# Patient Record
Sex: Male | Born: 1960 | Race: White | Hispanic: No | Marital: Married
Health system: Midwestern US, Community
[De-identification: ages and names within clinical notes are randomized; demographics above are authoritative.]

## PROBLEM LIST (undated history)

## (undated) DIAGNOSIS — Z8719 Personal history of other diseases of the digestive system: Secondary | ICD-10-CM

## (undated) DIAGNOSIS — M25511 Pain in right shoulder: Secondary | ICD-10-CM

## (undated) DIAGNOSIS — M109 Gout, unspecified: Secondary | ICD-10-CM

## (undated) DIAGNOSIS — I10 Essential (primary) hypertension: Secondary | ICD-10-CM

## (undated) DIAGNOSIS — E785 Hyperlipidemia, unspecified: Secondary | ICD-10-CM

## (undated) DIAGNOSIS — N289 Disorder of kidney and ureter, unspecified: Secondary | ICD-10-CM

## (undated) DIAGNOSIS — N401 Enlarged prostate with lower urinary tract symptoms: Secondary | ICD-10-CM

## (undated) HISTORY — DX: Pain in right shoulder: M25.511

## (undated) HISTORY — DX: Hyperlipidemia, unspecified: E78.5

## (undated) HISTORY — DX: Disorder of kidney and ureter, unspecified: N28.9

## (undated) HISTORY — DX: Gout, unspecified: M10.9

## (undated) HISTORY — DX: Essential (primary) hypertension: I10

## (undated) HISTORY — DX: Personal history of other diseases of the digestive system: Z87.19

---

## 2001-04-13 DIAGNOSIS — Z8719 Personal history of other diseases of the digestive system: Secondary | ICD-10-CM

## 2001-04-13 HISTORY — DX: Personal history of other diseases of the digestive system: Z87.19

## 2004-12-10 ENCOUNTER — Ambulatory Visit: Payer: Self-pay | Admitting: Internal Medicine

## 2005-05-29 ENCOUNTER — Ambulatory Visit: Payer: Self-pay | Admitting: Internal Medicine

## 2005-06-26 ENCOUNTER — Ambulatory Visit: Payer: Self-pay | Admitting: Internal Medicine

## 2006-01-05 ENCOUNTER — Ambulatory Visit: Payer: Self-pay | Admitting: Internal Medicine

## 2006-01-21 ENCOUNTER — Ambulatory Visit: Payer: Self-pay | Admitting: Internal Medicine

## 2006-12-14 ENCOUNTER — Ambulatory Visit: Payer: Self-pay | Admitting: Internal Medicine

## 2006-12-14 DIAGNOSIS — I10 Essential (primary) hypertension: Secondary | ICD-10-CM

## 2006-12-14 DIAGNOSIS — E785 Hyperlipidemia, unspecified: Secondary | ICD-10-CM | POA: Insufficient documentation

## 2006-12-30 ENCOUNTER — Telehealth: Payer: Self-pay | Admitting: Internal Medicine

## 2006-12-31 ENCOUNTER — Ambulatory Visit: Payer: Self-pay | Admitting: Internal Medicine

## 2007-01-07 ENCOUNTER — Ambulatory Visit: Payer: Self-pay | Admitting: Internal Medicine

## 2007-01-10 LAB — CONVERTED CEMR LAB
BUN: 19 mg/dL (ref 6–23)
CO2: 30 meq/L (ref 19–32)
Calcium: 9.6 mg/dL (ref 8.4–10.5)
Chloride: 106 meq/L (ref 96–112)
Cholesterol: 262 mg/dL (ref 0–200)
Creatinine, Ser: 1.7 mg/dL — ABNORMAL HIGH (ref 0.4–1.5)
Direct LDL: 169.3 mg/dL
GFR calc Af Amer: 56 mL/min
GFR calc non Af Amer: 46 mL/min
Glucose, Bld: 94 mg/dL (ref 70–99)
HDL: 42.1 mg/dL (ref >39.0)
Potassium: 5.2 meq/L — ABNORMAL HIGH (ref 3.5–5.1)
Sodium: 140 meq/L (ref 135–145)
Total CHOL/HDL Ratio: 6.2
Triglycerides: 185 mg/dL — ABNORMAL HIGH (ref 0–149)
VLDL: 37 mg/dL (ref 0–40)

## 2007-03-22 ENCOUNTER — Ambulatory Visit: Payer: Self-pay | Admitting: Internal Medicine

## 2007-05-12 ENCOUNTER — Ambulatory Visit: Payer: Self-pay | Admitting: Internal Medicine

## 2007-05-16 LAB — CONVERTED CEMR LAB
BUN: 24 mg/dL — ABNORMAL HIGH
CO2: 33 meq/L — ABNORMAL HIGH
Calcium: 10 mg/dL
Chloride: 103 meq/L
Cholesterol: 296 mg/dL
Creatinine, Ser: 1.8 mg/dL — ABNORMAL HIGH
Direct LDL: 154.7 mg/dL
GFR calc Af Amer: 53 mL/min
GFR calc non Af Amer: 43 mL/min
Glucose, Bld: 99 mg/dL
HDL: 42.6 mg/dL
Potassium: 5.5 meq/L — ABNORMAL HIGH
Sodium: 141 meq/L
Total CHOL/HDL Ratio: 6.9
Triglycerides: 241 mg/dL
VLDL: 48 mg/dL — ABNORMAL HIGH

## 2007-05-23 ENCOUNTER — Ambulatory Visit: Payer: Self-pay | Admitting: Internal Medicine

## 2007-05-25 ENCOUNTER — Telehealth: Payer: Self-pay | Admitting: Internal Medicine

## 2007-05-27 ENCOUNTER — Encounter: Payer: Self-pay | Admitting: Internal Medicine

## 2007-08-08 ENCOUNTER — Ambulatory Visit: Payer: Self-pay | Admitting: Internal Medicine

## 2007-08-08 LAB — CONVERTED CEMR LAB
Alkaline Phosphatase: 58 units/L (ref 39–117)
Bilirubin, Direct: 0.1 mg/dL (ref 0.0–0.3)
LDL Cholesterol: 127 mg/dL — ABNORMAL HIGH (ref 0–99)
Total Bilirubin: 1.2 mg/dL (ref 0.3–1.2)
Total CHOL/HDL Ratio: 4.5
VLDL: 21 mg/dL (ref 0–40)

## 2007-08-31 ENCOUNTER — Ambulatory Visit: Payer: Self-pay | Admitting: Internal Medicine

## 2007-09-28 ENCOUNTER — Encounter: Payer: Self-pay | Admitting: Internal Medicine

## 2008-02-20 ENCOUNTER — Ambulatory Visit: Payer: Self-pay | Admitting: Internal Medicine

## 2008-02-20 LAB — CONVERTED CEMR LAB
ALT: 73 units/L — ABNORMAL HIGH (ref 0–53)
CO2: 31 meq/L (ref 19–32)
Calcium: 9.3 mg/dL (ref 8.4–10.5)
Creatinine, Ser: 1.7 mg/dL — ABNORMAL HIGH (ref 0.4–1.5)
GFR calc Af Amer: 56 mL/min
GFR calc non Af Amer: 46 mL/min
HDL: 46.9 mg/dL (ref >39.0)
LDL Cholesterol: 95 mg/dL (ref 0–99)
Total Bilirubin: 1.2 mg/dL (ref 0.3–1.2)
Total CHOL/HDL Ratio: 3.5
Triglycerides: 102 mg/dL (ref 0–149)
VLDL: 20 mg/dL (ref 0–40)

## 2008-02-27 ENCOUNTER — Ambulatory Visit: Payer: Self-pay | Admitting: Internal Medicine

## 2008-08-20 ENCOUNTER — Ambulatory Visit: Payer: Self-pay | Admitting: Internal Medicine

## 2008-08-20 LAB — CONVERTED CEMR LAB
BUN: 25 mg/dL — ABNORMAL HIGH (ref 6–23)
Basophils Absolute: 0 10*3/uL (ref 0.0–0.1)
Bilirubin Urine: NEGATIVE
Chloride: 106 meq/L (ref 96–112)
Cholesterol: 197 mg/dL (ref 0–200)
Eosinophils Absolute: 0.2 10*3/uL (ref 0.0–0.7)
Glucose, Bld: 99 mg/dL (ref 70–99)
Glucose, Urine, Semiquant: NEGATIVE
HCT: 44.1 % (ref 39.0–52.0)
Ketones, urine, test strip: NEGATIVE
Lymphs Abs: 1.3 10*3/uL (ref 0.7–4.0)
MCV: 90.6 fL (ref 78.0–100.0)
Monocytes Absolute: 0.4 10*3/uL (ref 0.1–1.0)
Platelets: 176 10*3/uL (ref 150.0–400.0)
Potassium: 4.4 meq/L (ref 3.5–5.1)
RDW: 12.6 % (ref 11.5–14.6)
TSH: 1.38 microintl units/mL (ref 0.35–5.50)
Total Bilirubin: 1.2 mg/dL (ref 0.3–1.2)
VLDL: 17.6 mg/dL (ref 0.0–40.0)
pH: 6.5

## 2008-08-27 ENCOUNTER — Ambulatory Visit: Payer: Self-pay | Admitting: Internal Medicine

## 2008-12-07 ENCOUNTER — Encounter: Payer: Self-pay | Admitting: Internal Medicine

## 2009-02-18 ENCOUNTER — Ambulatory Visit: Payer: Self-pay | Admitting: Internal Medicine

## 2009-02-18 LAB — CONVERTED CEMR LAB
ALT: 46 units/L (ref 0–53)
BUN: 24 mg/dL — ABNORMAL HIGH (ref 6–23)
Bilirubin, Direct: 0 mg/dL (ref 0.0–0.3)
Cholesterol: 187 mg/dL (ref 0–200)
GFR calc non Af Amer: 42.92 mL/min (ref >60)
HDL: 47 mg/dL (ref >39.00)
Potassium: 4.7 meq/L (ref 3.5–5.1)
Sodium: 138 meq/L (ref 135–145)
Total Protein: 6.9 g/dL (ref 6.0–8.3)
VLDL: 33.6 mg/dL (ref 0.0–40.0)

## 2009-03-13 HISTORY — PX: UMBILICAL HERNIA REPAIR: SHX196

## 2009-04-02 ENCOUNTER — Ambulatory Visit: Payer: Self-pay | Admitting: Internal Medicine

## 2010-03-20 ENCOUNTER — Ambulatory Visit: Payer: Self-pay | Admitting: Internal Medicine

## 2010-03-20 LAB — CONVERTED CEMR LAB
Alkaline Phosphatase: 56 units/L (ref 39–117)
BUN: 27 mg/dL — ABNORMAL HIGH (ref 6–23)
Bilirubin Urine: NEGATIVE
Bilirubin, Direct: 0.1 mg/dL (ref 0.0–0.3)
CO2: 27 meq/L (ref 19–32)
Calcium: 9.6 mg/dL (ref 8.4–10.5)
Chloride: 102 meq/L (ref 96–112)
Cholesterol: 208 mg/dL — ABNORMAL HIGH (ref 0–200)
Creatinine, Ser: 1.9 mg/dL — ABNORMAL HIGH (ref 0.4–1.5)
Direct LDL: 105.3 mg/dL
Eosinophils Absolute: 0.2 10*3/uL (ref 0.0–0.7)
Glucose, Urine, Semiquant: NEGATIVE
Ketones, urine, test strip: NEGATIVE
Lymphocytes Relative: 33.6 % (ref 12.0–46.0)
MCHC: 34.6 g/dL (ref 30.0–36.0)
MCV: 90.8 fL (ref 78.0–100.0)
Monocytes Absolute: 0.4 10*3/uL (ref 0.1–1.0)
Neutrophils Relative %: 55.5 % (ref 43.0–77.0)
Platelets: 239 10*3/uL (ref 150.0–400.0)
Total Bilirubin: 0.7 mg/dL (ref 0.3–1.2)
Total CHOL/HDL Ratio: 5
Triglycerides: 347 mg/dL — ABNORMAL HIGH (ref 0.0–149.0)
Urobilinogen, UA: 0.2
VLDL: 69.4 mg/dL — ABNORMAL HIGH (ref 0.0–40.0)
WBC: 5.3 10*3/uL (ref 4.5–10.5)
pH: 6

## 2010-03-27 ENCOUNTER — Ambulatory Visit: Payer: Self-pay | Admitting: Internal Medicine

## 2010-03-27 DIAGNOSIS — N183 Chronic kidney disease, stage 3 (moderate): Secondary | ICD-10-CM

## 2010-05-15 NOTE — Assessment & Plan Note (Signed)
Vital Signs:  Patient profile:   50 year old male Height:      73 inches Weight:      185 pounds Temp:     98.4 degrees F BP sitting:   116 / 82  Vitals Entered By: Alfred Levins, CMA (March 27, 2010 8:20 AM)  History of Present Illness: CPX  recent URI---sxs resolving  Current Problems (verified): 1)  Preventive Health Care  (ICD-V70.0) 2)  Renal Insufficiency  (ICD-588.9) 3)  Hyperlipidemia  (ICD-272.4) 4)  Hypertension  (ICD-401.9)  Current Medications (verified): 1)  Diclofenac Sodium 75 Mg Tbec (Diclofenac Sodium) .... Take 1 Tablet By Mouth Once A Day As Needed Pain 2)  Simvastatin 40 Mg Tabs (Simvastatin) .... Take One Tablet At Bedtime 3)  Losartan Potassium 100 Mg Tabs (Losartan Potassium) .... Take 1 Tablet By Mouth Once A Day  Allergies (verified): 1)  ! Ace Inhibitors  Past History:  Past Medical History: Last updated: 02/27/2008 Hyperlipidemia Hypertension Renal insufficiency--has had evaluation [place]. (result of infection as a child)  Past Surgical History: Last updated: 04/02/2009 umbilical hernia repair 12/10  Family History: Last updated: 05/23/2007 GI cancer--mother father COPD--hx MI/CHF mother with hyperlipidemeia-elevated HDL  Social History: Last updated: 12/31/2006 Married Never Smoked travels frequently with work  Risk Factors: Exercise: yes (12/14/2006)  Risk Factors: Smoking Status: never (04/02/2009)  Physical Exam  General:  well-developed well-nourished male in no acute distress. He appears intake. HEENT exam atraumatic, normocephalic symmetric her muscles are intact. Neck is supple. Neck without jugular venous distention. Submandibular glands are palpable normal. Neck:  No deformities, masses, or tenderness noted. Lungs:  normal respiratory effort and no intercostal retractions.   Heart:  normal rate and regular rhythm.   Abdomen:  soft and non-tender.   Skin:  turgor normal and color normal.   Psych:  normally  interactive and good eye contact.     Impression & Recommendations:  Problem # 1:  PREVENTIVE HEALTH CARE (ICD-V70.0) health maint utd  Problem # 2:  RENAL INSUFFICIENCY (ICD-588.9) chronic  Problem # 3:  HYPERLIPIDEMIA (ICD-272.4)  His updated medication list for this problem includes:    Simvastatin 40 Mg Tabs (Simvastatin) .Marland Kitchen... Take one tablet at bedtime  Labs Reviewed: SGOT: 32 (03/20/2010)   SGPT: 37 (03/20/2010)   HDL:39.80 (03/20/2010), 47.00 (02/18/2009)  LDL:106 (02/18/2009), 128 (08/20/2008)  Chol:208 (03/20/2010), 187 (02/18/2009)  Trig:347.0 (03/20/2010), 168.0 (02/18/2009)  Problem # 4:  HYPERTENSION (ICD-401.9)  His updated medication list for this problem includes:    Losartan Potassium 100 Mg Tabs (Losartan potassium) .Marland Kitchen... Take 1 tablet by mouth once a day  BP today: 116/82 Prior BP: 122/60 (04/02/2009)  Labs Reviewed: K+: 4.9 (03/20/2010) Creat: : 1.9 (03/20/2010)   Chol: 208 (03/20/2010)   HDL: 39.80 (03/20/2010)   LDL: 106 (02/18/2009)   TG: 347.0 (03/20/2010)  Complete Medication List: 1)  Diclofenac Sodium 75 Mg Tbec (Diclofenac sodium) .... Take 1 tablet by mouth once a day as needed pain 2)  Simvastatin 40 Mg Tabs (Simvastatin) .... Take one tablet at bedtime 3)  Losartan Potassium 100 Mg Tabs (Losartan potassium) .... Take 1 tablet by mouth once a day  Patient Instructions: 1)  Please schedule a follow-up appointment in 6 months. labs only 2)  [id number]_[id number] Prescriptions: LOSARTAN POTASSIUM 100 MG TABS (LOSARTAN POTASSIUM) Take 1 tablet by mouth once a day  #90 x 3   Entered and Authorized by:   Birdie Sons MD   Signed by:   Birdie Sons MD on  03/27/2010   Method used:   Faxed to ...       [address] [facility] (mail-order)       [address]       [place], [place]  [address]       Ph: [phone]       Fax: [phone]   RxID:   [id number] SIMVASTATIN 40 MG TABS (SIMVASTATIN) Take one tablet at bedtime  #90 x 3   Entered and Authorized by:   Birdie Sons MD   Signed by:   Birdie Sons MD on 03/27/2010   Method used:   Faxed to ...       [facility] (mail-order)       [address]       [place], [place]  [address]       Ph: [phone]       Fax: [phone]   RxID:   [id number] LOSARTAN POTASSIUM 100 MG TABS (LOSARTAN POTASSIUM) Take 1 tablet by mouth once a day  #90 x 3   Entered and Authorized by:   Birdie Sons MD   Signed by:   Birdie Sons MD on 03/27/2010   Method used:   Print then Give to Patient   RxID:   [id number] SIMVASTATIN 40 MG TABS (SIMVASTATIN) Take one tablet at bedtime  #90 x 3   Entered and Authorized by:   Birdie Sons MD   Signed by:   Birdie Sons MD on 03/27/2010   Method used:   Print then Give to Patient   RxID:   [id number] NIOEVOJJ POTASSIUM 100 MG TABS (LOSARTAN POTASSIUM) Take 1 tablet by mouth once a day  #90 x 3   Entered and Authorized by:   Birdie Sons MD   Signed by:   Birdie Sons MD on 03/27/2010   Method used:   [facility]        [facility] [address] [facility]       [address]       [place], [place]  [address]       Ph: [phone] or [phone]       Fax: [phone]   RxID:   [id number] SIMVASTATIN 40 MG TABS (SIMVASTATIN) Take one tablet at bedtime  #90 x 3   Entered and Authorized by:   Birdie Sons MD   Signed by:   Birdie Sons MD on 03/27/2010   Method used:   [facility]        [facility] [address]       [address]       [place], [place]  [address]       Ph: [phone] or [phone]       Fax: [phone]   RxID:   [id number]    Orders Added: 1)  Est. Patient 40-64 years [phone]

## 2010-07-28 ENCOUNTER — Other Ambulatory Visit: Payer: Self-pay | Admitting: Internal Medicine

## 2010-07-28 DIAGNOSIS — Z0000 Encounter for general adult medical examination without abnormal findings: Secondary | ICD-10-CM

## 2010-09-19 ENCOUNTER — Encounter: Payer: Self-pay | Admitting: Internal Medicine

## 2010-09-26 ENCOUNTER — Other Ambulatory Visit: Payer: Self-pay

## 2010-10-10 ENCOUNTER — Other Ambulatory Visit (INDEPENDENT_AMBULATORY_CARE_PROVIDER_SITE_OTHER): Payer: Managed Care, Other (non HMO)

## 2010-10-10 DIAGNOSIS — N289 Disorder of kidney and ureter, unspecified: Secondary | ICD-10-CM

## 2010-10-10 LAB — BASIC METABOLIC PANEL
CO2: 28 mEq/L (ref 19–32)
Chloride: 105 mEq/L (ref 96–112)
Potassium: 4.8 mEq/L (ref 3.5–5.1)
Sodium: 139 mEq/L (ref 135–145)

## 2010-12-17 ENCOUNTER — Telehealth: Payer: Self-pay | Admitting: *Deleted

## 2010-12-17 NOTE — Telephone Encounter (Signed)
Pt is asking if Dr. Cato Mulligan will call in oral steroids for his poison ivy to [facility].?

## 2010-12-18 MED ORDER — PREDNISONE 20 MG PO TABS: 20.0000 mg | ORAL_TABLET | Freq: Two times a day (BID) | ORAL | Status: DC

## 2010-12-18 NOTE — Telephone Encounter (Signed)
Prednisone 40 mg po qd for 5 days

## 2010-12-18 NOTE — Telephone Encounter (Signed)
Notified pt on voicemail.

## 2010-12-18 NOTE — Telephone Encounter (Signed)
Pt called back and said that rash has spread all over his body. Pt isn't sure if its poison ivy or not. Rash is not oozing, but is very itchy and spreading. Pt req oral steroids to be called in to [facility] asap today.

## 2010-12-19 ENCOUNTER — Other Ambulatory Visit: Payer: Self-pay | Admitting: *Deleted

## 2010-12-19 MED ORDER — PREDNISONE 20 MG PO TABS: 20.0000 mg | ORAL_TABLET | Freq: Every day | ORAL | Status: AC

## 2010-12-19 MED ORDER — PREDNISONE 20 MG PO TABS: 20.0000 mg | ORAL_TABLET | Freq: Two times a day (BID) | ORAL | Status: AC

## 2010-12-19 NOTE — Telephone Encounter (Signed)
Pharmacy did not get meds. Called and sent electronically.

## 2011-02-27 ENCOUNTER — Other Ambulatory Visit: Payer: Self-pay | Admitting: *Deleted

## 2011-02-27 MED ORDER — SIMVASTATIN 40 MG PO TABS: 40.0000 mg | ORAL_TABLET | Freq: Every evening | ORAL | Status: DC

## 2011-02-27 MED ORDER — LOSARTAN POTASSIUM 100 MG PO TABS: 100.0000 mg | ORAL_TABLET | Freq: Every day | ORAL | Status: DC

## 2011-03-02 ENCOUNTER — Telehealth: Payer: Self-pay | Admitting: *Deleted

## 2011-03-02 NOTE — Telephone Encounter (Signed)
Pt needs to speak to [facility] ASAP for refills on his meds.  There is a note on the telephone with all the details.

## 2011-03-03 MED ORDER — LOSARTAN POTASSIUM 100 MG PO TABS: 100.0000 mg | ORAL_TABLET | Freq: Every day | ORAL | Status: DC

## 2011-03-03 MED ORDER — SIMVASTATIN 40 MG PO TABS: 40.0000 mg | ORAL_TABLET | Freq: Every evening | ORAL | Status: DC

## 2011-03-03 NOTE — Telephone Encounter (Signed)
Addended by: Alfred Levins D on: 03/03/2011 04:07 PM   Modules accepted: Orders

## 2011-03-03 NOTE — Telephone Encounter (Signed)
Refills sent to [facility] electronically, pt aware

## 2011-03-10 ENCOUNTER — Telehealth: Payer: Self-pay | Admitting: *Deleted

## 2011-03-10 NOTE — Telephone Encounter (Signed)
Patient is calling because he has had a cough and sinus pressure for 3 weeks.  He would like a rx called into [facility] if possible.

## 2011-03-10 NOTE — Telephone Encounter (Signed)
Left message on machine for patient to return our call 

## 2011-03-10 NOTE — Telephone Encounter (Signed)
mucinex dm bid for 7 days Saline nasal spray 3 times daily

## 2011-03-11 NOTE — Telephone Encounter (Signed)
patient  Is aware 

## 2011-03-20 ENCOUNTER — Other Ambulatory Visit (INDEPENDENT_AMBULATORY_CARE_PROVIDER_SITE_OTHER): Payer: Managed Care, Other (non HMO)

## 2011-03-20 ENCOUNTER — Telehealth: Payer: Self-pay | Admitting: Internal Medicine

## 2011-03-20 DIAGNOSIS — Z0000 Encounter for general adult medical examination without abnormal findings: Secondary | ICD-10-CM

## 2011-03-20 LAB — POCT URINALYSIS DIPSTICK
Glucose, UA: NEGATIVE
Nitrite, UA: NEGATIVE
Urobilinogen, UA: 0.2

## 2011-03-20 LAB — CBC WITH DIFFERENTIAL/PLATELET
Basophils Absolute: 0 10*3/uL (ref 0.0–0.1)
Hemoglobin: 15.4 g/dL (ref 13.0–17.0)
Lymphocytes Relative: 32.2 % (ref 12.0–46.0)
Monocytes Relative: 7.8 % (ref 3.0–12.0)
Neutro Abs: 3 10*3/uL (ref 1.4–7.7)
RDW: 13.2 % (ref 11.5–14.6)

## 2011-03-20 LAB — BASIC METABOLIC PANEL
Calcium: 9.6 mg/dL (ref 8.4–10.5)
GFR: 39.74 mL/min — ABNORMAL LOW (ref >60.00)
Glucose, Bld: 101 mg/dL — ABNORMAL HIGH (ref 70–99)
Sodium: 140 mEq/L (ref 135–145)

## 2011-03-20 LAB — LDL CHOLESTEROL, DIRECT: Direct LDL: 95 mg/dL

## 2011-03-20 LAB — TSH: TSH: 1.69 u[IU]/mL (ref 0.35–5.50)

## 2011-03-20 LAB — LIPID PANEL
Total CHOL/HDL Ratio: 4
VLDL: 44.8 mg/dL — ABNORMAL HIGH (ref 0.0–40.0)

## 2011-03-20 LAB — HEPATIC FUNCTION PANEL
AST: 26 U/L (ref 0–37)
Alkaline Phosphatase: 51 U/L (ref 39–117)
Total Bilirubin: 0.8 mg/dL (ref 0.3–1.2)

## 2011-03-20 LAB — PSA: PSA: 1.86 ng/mL (ref 0.10–4.00)

## 2011-03-20 MED ORDER — LOSARTAN POTASSIUM 100 MG PO TABS: 100.0000 mg | ORAL_TABLET | Freq: Every day | ORAL | Status: DC

## 2011-03-20 NOTE — Telephone Encounter (Signed)
rx sent in electronically, pt aware 

## 2011-03-20 NOTE — Telephone Encounter (Signed)
Please contact patient regarding medication refill he will need before his appointment.  It is his blood pressure medication - he is not sure of the name of the drug.

## 2011-03-31 ENCOUNTER — Ambulatory Visit: Payer: Managed Care, Other (non HMO) | Admitting: Internal Medicine

## 2011-04-28 ENCOUNTER — Ambulatory Visit (INDEPENDENT_AMBULATORY_CARE_PROVIDER_SITE_OTHER): Payer: Managed Care, Other (non HMO) | Admitting: Internal Medicine

## 2011-04-28 ENCOUNTER — Encounter: Payer: Self-pay | Admitting: Internal Medicine

## 2011-04-28 DIAGNOSIS — N259 Disorder resulting from impaired renal tubular function, unspecified: Secondary | ICD-10-CM

## 2011-04-28 DIAGNOSIS — I10 Essential (primary) hypertension: Secondary | ICD-10-CM

## 2011-04-28 NOTE — Patient Instructions (Signed)
Knee Exercises EXERCISES RANGE OF MOTION(ROM) AND STRETCHING EXERCISES These exercises may help you when beginning to rehabilitate your injury. Your symptoms may resolve with or without further involvement from your physician, physical therapist or athletic trainer. While completing these exercises, remember:   Restoring tissue flexibility helps normal motion to return to the joints. This allows healthier, less painful movement and activity.   An effective stretch should be held for at least 30 seconds.   A stretch should never be painful. You should only feel a gentle lengthening or release in the stretched tissue.  STRETCH - Knee Extension, Prone  Lie on your stomach on a firm surface, such as a bed or countertop. Place your right / left knee and leg just beyond the edge of the surface. You may wish to place a towel under the far end of your right / left thigh for comfort.   Relax your leg muscles and allow gravity to straighten your knee. Your clinician may advise you to add an ankle weight if more resistance is helpful for you.  You should feel a stretch in the back of your right / left knee.y. * Your physician, physical therapist or athletic trainer may ask you to add ankle weight to enhance your stretch.  RANGE OF MOTION - Knee Flexion, Active  Lie on your back with both knees straight. (If this causes back discomfort, bend your opposite knee, placing your foot flat on the floor.)   Slowly slide your heel back toward your buttocks until you feel a gentle stretch in the front of your knee or thigh.    Slowly slide your heel back to the starting position.    STRETCH - Quadriceps, Prone   Lie on your stomach on a firm surface, such as a bed or padded floor.   Bend your right / left knee and grasp your ankle. If you are unable to reach, your ankle or pant leg, use a belt around your foot to lengthen your reach.   Gently pull your heel toward your buttocks. Your knee should not slide  out to the side. You should feel a stretch in the front of your thigh and/or knee.   STRETCH - Hamstrings, Supine   Lie on your back. Loop a belt or towel over the ball of your right / left foot.   Straighten your right / left knee and slowly pull on the belt to raise your leg. Do not allow the right / left knee to bend. Keep your opposite leg flat on the floor.  STRENGTHENING EXERCISES These exercises may help you when beginning to rehabilitate your injury. They may resolve your symptoms with or without further involvement from your physician, physical therapist or athletic trainer. While completing these exercises, remember:   Muscles can gain both the endurance and the strength needed for everyday activities through controlled exercises.   Complete these exercises as instructed by your physician, physical therapist or athletic trainer. Progress the resistance and repetitions only as guided.   You may experience muscle soreness or fatigue, but the pain or discomfort you are trying to eliminate should never worsen during these exercises. If this pain does worsen, stop and make certain you are following the directions exactly. If the pain is still present after adjustments, discontinue the exercise until you can discuss the trouble with your clinician.  STRENGTH - Quadriceps, Isometrics  Lie on your back with your right / left leg extended and your opposite knee bent.   Gradually tense  the muscles in the front of your right / left thigh. You should see either your knee cap slide up toward your hip or increased dimpling just above the knee. This motion will push the back of the knee down toward the floor/mat/bed on which you are lying.    Relax the muscles slowly and completely in between each repetition.  STRENGTH - Quadriceps, Short Arcs   Lie on your back. Place a _____4_____ inch towel roll under your knee so that the knee slightly bends.   Raise only your lower leg by tightening the  muscles in the front of your thigh. Do not allow your thigh to rise.  STRENGTH - Quadriceps, Straight Leg Raises  Quality counts! Watch for signs that the quadriceps muscle is working to insure you are strengthening the correct muscles and not "cheating" by substituting with healthier muscles.  Lay on your back with your right / left leg extended and your opposite knee bent.   Tense the muscles in the front of your right / left thigh. You should see either your knee cap slide up or increased dimpling just above the knee. Your thigh may even quiver.   Tighten these muscles even more and raise your leg 4 to 6 inches off the floor.    Keeping these muscles tense, lower your leg.  Relax the muscles slowly and completely in between each repetition  STRENGTH - Hamstring, Curls  Lay on your stomach with your legs extended. (If you lay on a bed, your feet may hang over the edge.)   Tighten the muscles in the back of your thigh to bend your right / left knee up to 90 degrees. Keep your hips flat on the bed/floor

## 2011-04-28 NOTE — Assessment & Plan Note (Signed)
BP Readings from Last 3 Encounters:  04/28/11 130/84  03/27/10 116/82  04/02/09 122/60   Reasonable control--continue meds

## 2011-04-28 NOTE — Progress Notes (Signed)
Patient ID: Devon Taylor, male   DOB: 04-Jun-1960, 51 y.o.   MRN: [id number]  htn---needs f/u  Renal insufficiency---long term---see PMH  Bilateral knee pain- states that he had right \\knee  injury in early 1980s. Has pain with activity. States that he has had knees evaluated by [place] [facility]. He is able to do all activities even with knee pain.  Lipids: tolerating meds  Past Medical History  Diagnosis Date  . Hyperlipidemia   . Hypertension     History   Social History  . Marital Status: Married    Spouse Name: N/A    Number of Children: N/A  . Years of Education: N/A   Occupational History  . Not on file.   Social History Main Topics  . Smoking status: Never Smoker   . Smokeless tobacco: Not on file  . Alcohol Use: Yes  . Drug Use: No  . Sexually Active: Not on file   Other Topics Concern  . Not on file   Social History Narrative  . No narrative on file    Past Surgical History  Procedure Date  . Umbilical hernia repair 12/10    Family History  Problem Relation Age of Onset  . Cancer Mother     GI  . Hyperlipidemia Mother   . COPD Father   . Heart disease Father     MI    Allergies  Allergen Reactions  . Ace Inhibitors     REACTION: cough with enalapril    Current Outpatient Prescriptions on File Prior to Visit  Medication Sig Dispense Refill  . losartan (COZAAR) 100 MG tablet Take 1 tablet (100 mg total) by mouth daily.  90 tablet  0  . simvastatin (ZOCOR) 40 MG tablet Take 1 tablet (40 mg total) by mouth every evening.  90 tablet  0     patient denies chest pain, shortness of breath, orthopnea. Denies lower extremity edema, abdominal pain, change in appetite, change in bowel movements. Patient denies rashes, musculoskeletal complaints. No other specific complaints in a complete review of systems.   BP 130/84  Pulse 64  Temp(Src) 97.6 F (36.4 C) (Oral)  Ht 6\' 1"  (1.854 m)  Wt 184 lb (83.462 kg)  BMI 24.28 kg/m2  well-developed well-nourished  male in no acute distress. HEENT exam atraumatic, normocephalic, neck supple without jugular venous distention. Chest clear to auscultation cardiac exam S1-S2 are regular. Abdominal exam overweight with bowel sounds, soft and nontender. Extremities no edema. Neurologic exam is alert with a normal gait.

## 2011-04-28 NOTE — Assessment & Plan Note (Signed)
Lab Results  Component Value Date   CREATININE 1.9* 03/20/2011   Stable Advised to avoid all NSAIDs.

## 2011-05-11 ENCOUNTER — Telehealth: Payer: Self-pay | Admitting: *Deleted

## 2011-05-11 DIAGNOSIS — Z0000 Encounter for general adult medical examination without abnormal findings: Secondary | ICD-10-CM

## 2011-05-11 NOTE — Telephone Encounter (Signed)
Please see original note 

## 2011-05-11 NOTE — Telephone Encounter (Signed)
Refer for colonoscopy Refer for derm check

## 2011-05-11 NOTE — Telephone Encounter (Signed)
Pt is asking for a referral for colonoscopy and a derm referral for 2 moles that he is concerned about.

## 2011-05-25 ENCOUNTER — Other Ambulatory Visit: Payer: Self-pay | Admitting: *Deleted

## 2011-05-25 MED ORDER — LOSARTAN POTASSIUM 100 MG PO TABS: 100.0000 mg | ORAL_TABLET | Freq: Every day | ORAL | Status: DC

## 2011-05-27 ENCOUNTER — Other Ambulatory Visit: Payer: Self-pay | Admitting: *Deleted

## 2011-05-27 MED ORDER — SIMVASTATIN 40 MG PO TABS: 40.0000 mg | ORAL_TABLET | Freq: Every evening | ORAL | Status: DC

## 2011-06-15 ENCOUNTER — Ambulatory Visit (INDEPENDENT_AMBULATORY_CARE_PROVIDER_SITE_OTHER): Payer: Managed Care, Other (non HMO) | Admitting: Internal Medicine

## 2011-06-15 ENCOUNTER — Encounter: Payer: Self-pay | Admitting: Internal Medicine

## 2011-06-15 VITALS — BP 120/88 | Temp 98.2°F | Wt 183.0 lb

## 2011-06-15 DIAGNOSIS — N259 Disorder resulting from impaired renal tubular function, unspecified: Secondary | ICD-10-CM

## 2011-06-15 DIAGNOSIS — E785 Hyperlipidemia, unspecified: Secondary | ICD-10-CM

## 2011-06-15 DIAGNOSIS — J069 Acute upper respiratory infection, unspecified: Secondary | ICD-10-CM

## 2011-06-15 DIAGNOSIS — R300 Dysuria: Secondary | ICD-10-CM

## 2011-06-15 DIAGNOSIS — I10 Essential (primary) hypertension: Secondary | ICD-10-CM

## 2011-06-15 LAB — BASIC METABOLIC PANEL
BUN: 25 mg/dL — ABNORMAL HIGH (ref 6–23)
Creatinine, Ser: 2.5 mg/dL — ABNORMAL HIGH (ref 0.4–1.5)
GFR: 28.97 mL/min — ABNORMAL LOW (ref >60.00)
Glucose, Bld: 90 mg/dL (ref 70–99)

## 2011-06-15 LAB — POCT URINALYSIS DIPSTICK
Nitrite, UA: NEGATIVE
Urobilinogen, UA: 0.2
pH, UA: 6

## 2011-06-15 MED ORDER — HYDROCODONE-HOMATROPINE 5-1.5 MG/5ML PO SYRP: 5.0000 mL | ORAL_SOLUTION | Freq: Four times a day (QID) | ORAL | Status: AC | PRN

## 2011-06-15 MED ORDER — CIPROFLOXACIN HCL 500 MG PO TABS: 500.0000 mg | ORAL_TABLET | Freq: Two times a day (BID) | ORAL | Status: AC

## 2011-06-15 NOTE — Progress Notes (Signed)
  Subjective:    Patient ID: Devon Taylor, male    DOB: [birth year], 51 y.o.   MRN: [id number]  HPI  51 year old patient who has a history of chronic kidney disease. The past 5 days he has had some dysuria and urgency. No dysuria he did 2 days ago but that has improved over the past 48 hours. There's been no fever or chills. He does have considerable URI symptoms with chest congestion weakness anorexia and cough. He has noted no hematuria or urethral discharge. He describes some generalized achiness and low back discomfort. Medical problems include dyslipidemia and hypertension as well.    Review of Systems  Constitutional: Positive for activity change, appetite change and fatigue. Negative for fever and chills.  HENT: Positive for congestion. Negative for hearing loss, ear pain, sore throat, trouble swallowing, neck stiffness, dental problem, voice change and tinnitus.   Eyes: Negative for pain, discharge and visual disturbance.  Respiratory: Positive for cough. Negative for chest tightness, wheezing and stridor.   Cardiovascular: Negative for chest pain, palpitations and leg swelling.  Gastrointestinal: Negative for nausea, vomiting, abdominal pain, diarrhea, constipation, blood in stool and abdominal distention.  Genitourinary: Negative for urgency, hematuria, flank pain, discharge, difficulty urinating and genital sores.  Musculoskeletal: Negative for myalgias, back pain, joint swelling, arthralgias and gait problem.  Skin: Negative for rash.  Neurological: Positive for weakness. Negative for dizziness, syncope, speech difficulty, numbness and headaches.  Hematological: Negative for adenopathy. Does not bruise/bleed easily.  Psychiatric/Behavioral: Negative for behavioral problems and dysphoric mood. The patient is not nervous/anxious.        Objective:   Physical Exam  Constitutional: He is oriented to person, place, and time. He appears well-developed and well-nourished. No distress.      Appears mildly ill but in no acute distress  HENT:  Head: Normocephalic.  Right Ear: External ear normal.  Left Ear: External ear normal.  Eyes: Conjunctivae and EOM are normal.  Neck: Normal range of motion.  Cardiovascular: Normal rate and normal heart sounds.   Pulmonary/Chest: Effort normal and breath sounds normal.  Abdominal: Soft. Bowel sounds are normal.       No CVA tenderness. No suprapubic discomfort  Musculoskeletal: Normal range of motion. He exhibits no edema and no tenderness.  Neurological: He is alert and oriented to person, place, and time.  Psychiatric: He has a normal mood and affect. His behavior is normal.          Assessment & Plan:   Viral URI Proteinuria/dysuria. There are no leukocytes nor history of urethral discharge. Patient does have a history of proteinuria in the past. We'll treat empirically with Cipro and followup a UA in 2 weeks. We'll check Bmet today

## 2011-06-15 NOTE — Patient Instructions (Signed)
Get plenty of rest, Drink lots of  clear liquids, and use Tylenol  for fever and discomfort.    Call or return to clinic prn if these symptoms worsen or fail to improve as anticipated.  Antibiotic therapy was prescribed for the child due to specific medical indications.   Return in 2 weeks for a followup urinalysis

## 2011-06-16 ENCOUNTER — Other Ambulatory Visit: Payer: Self-pay | Admitting: Internal Medicine

## 2011-06-16 ENCOUNTER — Encounter: Payer: Self-pay | Admitting: Internal Medicine

## 2011-06-16 DIAGNOSIS — Z87448 Personal history of other diseases of urinary system: Secondary | ICD-10-CM

## 2011-06-16 NOTE — Progress Notes (Signed)
Quick Note:  Spoke with wife - informed of labs and consult ordered. She will let him know and then he can call if he has questions - he is at work in a meeting ______

## 2011-06-30 ENCOUNTER — Ambulatory Visit: Payer: Managed Care, Other (non HMO) | Admitting: Internal Medicine

## 2011-08-04 ENCOUNTER — Encounter: Payer: Self-pay | Admitting: Gastroenterology

## 2011-08-05 ENCOUNTER — Other Ambulatory Visit: Payer: Self-pay | Admitting: Nephrology

## 2011-08-05 DIAGNOSIS — N183 Chronic kidney disease, stage 3 unspecified: Secondary | ICD-10-CM

## 2011-08-10 ENCOUNTER — Other Ambulatory Visit: Payer: Managed Care, Other (non HMO)

## 2011-08-24 ENCOUNTER — Other Ambulatory Visit: Payer: Managed Care, Other (non HMO)

## 2011-09-03 ENCOUNTER — Ambulatory Visit (AMBULATORY_SURGERY_CENTER): Payer: Managed Care, Other (non HMO) | Admitting: *Deleted

## 2011-09-03 VITALS — Ht 74.0 in | Wt 185.6 lb

## 2011-09-03 DIAGNOSIS — Z1211 Encounter for screening for malignant neoplasm of colon: Secondary | ICD-10-CM

## 2011-09-03 MED ORDER — PEG-KCL-NACL-NASULF-NA ASC-C 100 G PO SOLR: ORAL | Status: DC

## 2011-09-04 ENCOUNTER — Encounter: Payer: Self-pay | Admitting: Gastroenterology

## 2011-09-14 ENCOUNTER — Encounter: Payer: Self-pay | Admitting: Gastroenterology

## 2011-09-14 ENCOUNTER — Ambulatory Visit (AMBULATORY_SURGERY_CENTER): Payer: Managed Care, Other (non HMO) | Admitting: Gastroenterology

## 2011-09-14 VITALS — BP 140/87 | HR 80 | Temp 99.1°F | Resp 24 | Ht 74.0 in | Wt 185.0 lb

## 2011-09-14 DIAGNOSIS — D126 Benign neoplasm of colon, unspecified: Secondary | ICD-10-CM

## 2011-09-14 DIAGNOSIS — Z1211 Encounter for screening for malignant neoplasm of colon: Secondary | ICD-10-CM

## 2011-09-14 MED ORDER — SODIUM CHLORIDE 0.9 % IV SOLN: 500.0000 mL | INTRAVENOUS | Status: DC

## 2011-09-14 NOTE — Progress Notes (Signed)
Patient did not experience any of the following events: a burn prior to discharge; a fall within the facility; wrong site/side/patient/procedure/implant event; or a hospital transfer or hospital admission upon discharge from the facility. (G8907) Patient did not have preoperative order for IV antibiotic SSI prophylaxis. (G8918)  

## 2011-09-14 NOTE — Op Note (Signed)
[facility] [address] [place], [place]  [address]  COLONOSCOPY PROCEDURE REPORT  PATIENT:  Devon Taylor, Devon Taylor  MR#:  [id number] BIRTHDATE:  1960-12-17, 51 yrs. old  GENDER:  male ENDOSCOPIST:  Vania Rea. Jarold Motto, MD, [facility] REF. BY:  Birdie Sons, M.D. PROCEDURE DATE:  09/14/2011 PROCEDURE:  Colonoscopy with snare polypectomy ASA CLASS:  Class III INDICATIONS:  Routine Risk Screening GFR 25CC/MT MEDICATIONS:   propofol (Diprivan) 450 mg IV  DESCRIPTION OF PROCEDURE:   After the risks and benefits and of the procedure were explained, informed consent was obtained. Digital rectal exam was performed and revealed no abnormalities. The LB [id number] endoscope was introduced through the anus and advanced to the cecum, which was identified by both the appendix and ileocecal valve.  The quality of the prep was excellent, using MoviPrep.  The instrument was then slowly withdrawn as the colon was fully examined. <<PROCEDUREIMAGES>>  FINDINGS:  A pedunculated polyp was found. 15mm sigmoid colon polyp hot snare removed  This was otherwise a normal examination of the colon.   Retroflexed views in the rectum revealed no abnormalities.    The scope was then withdrawn from the patient and the procedure completed.  COMPLICATIONS:  None ENDOSCOPIC IMPRESSION: 1) Pedunculated polyp 2) Otherwise normal examination probable adenoma.r/o atypia. RECOMMENDATIONS: 1) Avoid all NSAIDS for the next 2 weeks. 2) Await biopsy results 3) Repeat colonoscopy in 5 years if polyp adenomatous; otherwise 10 years  REPEAT EXAM:  No  ______________________________ Vania Rea. Jarold Motto, MD, [facility]  CC:  n. eSIGNED:   Vania Rea. Lenee Franze at 09/14/2011 10:15 AM  Alyson Locket, [phone]

## 2011-09-14 NOTE — Patient Instructions (Signed)
YOU HAD AN ENDOSCOPIC PROCEDURE TODAY AT THE [facility]: Refer to the procedure report that was given to you for any specific questions about what was found during the examination.  If the procedure report does not answer your questions, please call your gastroenterologist to clarify.  If you requested that your care partner not be given the details of your procedure findings, then the procedure report has been included in a sealed envelope for you to review at your convenience later.  YOU SHOULD EXPECT: Some feelings of bloating in the abdomen. Passage of more gas than usual.  Walking can help get rid of the air that was put into your GI tract during the procedure and reduce the bloating. If you had a lower endoscopy (such as a colonoscopy or flexible sigmoidoscopy) you may notice spotting of blood in your stool or on the toilet paper. If you underwent a bowel prep for your procedure, then you may not have a normal bowel movement for a few days.  DIET: Your first meal following the procedure should be a light meal and then it is ok to progress to your normal diet.  A half-sandwich or bowl of soup is an example of a good first meal.  Heavy or fried foods are harder to digest and may make you feel nauseous or bloated.  Likewise meals heavy in dairy and vegetables can cause extra gas to form and this can also increase the bloating.  Drink plenty of fluids but you should avoid alcoholic beverages for 24 hours.  ACTIVITY: Your care partner should take you home directly after the procedure.  You should plan to take it easy, moving slowly for the rest of the day.  You can resume normal activity the day after the procedure however you should NOT DRIVE or use heavy machinery for 24 hours (because of the sedation medicines used during the test).    SYMPTOMS TO REPORT IMMEDIATELY: A gastroenterologist can be reached at any hour.  During normal business hours, 8:30 AM to 5:00 PM Monday through Friday,  call [phone].  After hours and on weekends, please call the GI answering service at [phone] who will take a message and have the physician on call contact you.   Following lower endoscopy (colonoscopy or flexible sigmoidoscopy):  Excessive amounts of blood in the stool  Significant tenderness or worsening of abdominal pains  Swelling of the abdomen that is new, acute  Fever of 100F or higher    FOLLOW UP: If any biopsies were taken you will be contacted by phone or by letter within the next 1-3 weeks.  Call your gastroenterologist if you have not heard about the biopsies in 3 weeks.  Our staff will call the home number listed on your records the next business day following your procedure to check on you and address any questions or concerns that you may have at that time regarding the information given to you following your procedure. This is a courtesy call and so if there is no answer at the home number and we have not heard from you through the emergency physician on call, we will assume that you have returned to your regular daily activities without incident.  SIGNATURES/CONFIDENTIALITY: You and/or your care partner have signed paperwork which will be entered into your electronic medical record.  These signatures attest to the fact that that the information above on your After Visit Summary has been reviewed and is understood.  Full responsibility of the confidentiality   of this discharge information lies with you and/or your care-partner.    Information on polyps given to you today  Avoid aspirin or anti inflammatory medications x 2 weeks

## 2011-09-14 NOTE — Progress Notes (Addendum)
Propofol per [facility]. See scanned crna intra procedure report. ewm 

## 2011-09-15 ENCOUNTER — Telehealth: Payer: Self-pay | Admitting: *Deleted

## 2011-09-15 NOTE — Telephone Encounter (Signed)
  Follow up Call-  Call back number 09/14/2011  Post procedure Call Back phone  # [phone]  Permission to leave phone message Yes     Patient questions:  Do you have a fever, pain , or abdominal swelling? no Pain Score  0 *  Have you tolerated food without any problems? yes  Have you been able to return to your normal activities? yes  Do you have any questions about your discharge instructions: Diet   no Medications  no Follow up visit  no  Do you have questions or concerns about your Care? no  Actions: * If pain score is 4 or above: No action needed, pain <4.

## 2011-09-18 ENCOUNTER — Encounter: Payer: Self-pay | Admitting: Gastroenterology

## 2011-09-21 ENCOUNTER — Other Ambulatory Visit: Payer: Managed Care, Other (non HMO)

## 2012-03-21 ENCOUNTER — Other Ambulatory Visit: Payer: Managed Care, Other (non HMO)

## 2012-03-28 ENCOUNTER — Encounter: Payer: Managed Care, Other (non HMO) | Admitting: Internal Medicine

## 2012-07-06 ENCOUNTER — Telehealth: Payer: Self-pay | Admitting: Internal Medicine

## 2012-07-06 NOTE — Telephone Encounter (Signed)
PT is requesting a refill of his simvastatin (ZOCOR) 40 MG tablet. Please assist.

## 2012-07-07 MED ORDER — SIMVASTATIN 40 MG PO TABS: 40.0000 mg | ORAL_TABLET | Freq: Every evening | ORAL | Status: DC

## 2012-07-07 NOTE — Telephone Encounter (Signed)
rx sent in electronically 

## 2012-08-02 ENCOUNTER — Ambulatory Visit (INDEPENDENT_AMBULATORY_CARE_PROVIDER_SITE_OTHER): Payer: Managed Care, Other (non HMO) | Admitting: Internal Medicine

## 2012-08-02 ENCOUNTER — Encounter: Payer: Self-pay | Admitting: Internal Medicine

## 2012-08-02 VITALS — BP 122/80 | HR 62 | Temp 98.6°F | Resp 18 | Wt 188.0 lb

## 2012-08-02 DIAGNOSIS — I10 Essential (primary) hypertension: Secondary | ICD-10-CM

## 2012-08-02 DIAGNOSIS — N259 Disorder resulting from impaired renal tubular function, unspecified: Secondary | ICD-10-CM

## 2012-08-02 DIAGNOSIS — E785 Hyperlipidemia, unspecified: Secondary | ICD-10-CM

## 2012-08-02 DIAGNOSIS — I8390 Asymptomatic varicose veins of unspecified lower extremity: Secondary | ICD-10-CM

## 2012-08-02 NOTE — Progress Notes (Signed)
Subjective:    Patient ID: Devon Taylor, male    DOB: 1960-08-04, 52 y.o.   MRN: [id number]  HPI  52 year old patient who has treated hypertension dyslipidemia and a history of chronic kidney disease. He is followed by nephrology.  His main complaint is a painful varix involving his right medial knee region. He also has some dermatologic concerns.  Past Medical History  Diagnosis Date  . Hyperlipidemia   . Hypertension   . Renal impairment     has 1 functioning kidney  . Hx of ulcerative colitis 2003    History   Social History  . Marital Status: Married    Spouse Name: N/A    Number of Children: N/A  . Years of Education: N/A   Occupational History  . Not on file.   Social History Main Topics  . Smoking status: Never Smoker   . Smokeless tobacco: Never Used  . Alcohol Use: 12.6 oz/week    21 Glasses of wine per week  . Drug Use: No  . Sexually Active: Not on file   Other Topics Concern  . Not on file   Social History Narrative  . No narrative on file    Past Surgical History  Procedure Laterality Date  . Umbilical hernia repair  12/10    Family History  Problem Relation Age of Onset  . Cancer Mother     GI  . Hyperlipidemia Mother   . Stomach cancer Mother     lymphoma  . COPD Father   . Heart disease Father     MI  . Colon cancer Neg Hx     Allergies  Allergen Reactions  . Ace Inhibitors     REACTION: cough with enalapril  . Nsaids     Renal Insufficiency---he has been advised to avoid all NSAIDs    Current Outpatient Prescriptions on File Prior to Visit  Medication Sig Dispense Refill  . fish oil-omega-3 fatty acids 1000 MG capsule Take 1 g by mouth daily.      . simvastatin (ZOCOR) 40 MG tablet Take 1 tablet (40 mg total) by mouth every evening.  90 tablet  0   No current facility-administered medications on file prior to visit.    BP 122/80  Pulse 62  Temp(Src) 98.6 F (37 C) (Oral)  Resp 18  Wt 188 lb (85.276 kg)  BMI 24.13 kg/m2   SpO2 98%       Review of Systems  Constitutional: Negative for fever, chills, appetite change and fatigue.  HENT: Negative for hearing loss, ear pain, congestion, sore throat, trouble swallowing, neck stiffness, dental problem, voice change and tinnitus.   Eyes: Negative for pain, discharge and visual disturbance.  Respiratory: Negative for cough, chest tightness, wheezing and stridor.   Cardiovascular: Negative for chest pain, palpitations and leg swelling.  Gastrointestinal: Negative for nausea, vomiting, abdominal pain, diarrhea, constipation, blood in stool and abdominal distention.  Genitourinary: Negative for urgency, hematuria, flank pain, discharge, difficulty urinating and genital sores.  Musculoskeletal: Negative for myalgias, back pain, joint swelling, arthralgias and gait problem.  Skin: Positive for rash.  Neurological: Negative for dizziness, syncope, speech difficulty, weakness, numbness and headaches.  Hematological: Negative for adenopathy. Does not bruise/bleed easily.  Psychiatric/Behavioral: Negative for behavioral problems and dysphoric mood. The patient is not nervous/anxious.        Objective:   Physical Exam  Constitutional: He is oriented to person, place, and time. He appears well-developed.  HENT:  Head: Normocephalic.  Right Ear:  External ear normal.  Left Ear: External ear normal.  Eyes: Conjunctivae and EOM are normal.  Neck: Normal range of motion.  Cardiovascular: Normal rate and normal heart sounds.   Pulmonary/Chest: Breath sounds normal.  Abdominal: Bowel sounds are normal.  Musculoskeletal: Normal range of motion. He exhibits no edema and no tenderness.  Neurological: He is alert and oriented to person, place, and time.  Skin:  Tender varix right medial knee  Psychiatric: He has a normal mood and affect. His behavior is normal.          Assessment & Plan:   Symptomatic varix right medial knee Hypertension Chronic kidney disease.  Followup nephrology   CPX here in December

## 2012-08-02 NOTE — Patient Instructions (Addendum)
Vascular/vein specialist consultation as discussed  Limit your sodium (Salt) intake

## 2012-08-08 ENCOUNTER — Other Ambulatory Visit: Payer: Self-pay | Admitting: *Deleted

## 2012-08-08 DIAGNOSIS — I83893 Varicose veins of bilateral lower extremities with other complications: Secondary | ICD-10-CM

## 2012-08-29 ENCOUNTER — Encounter: Payer: Managed Care, Other (non HMO) | Admitting: Vascular Surgery

## 2012-10-24 ENCOUNTER — Encounter: Payer: Managed Care, Other (non HMO) | Admitting: Vascular Surgery

## 2012-11-15 ENCOUNTER — Ambulatory Visit: Payer: Managed Care, Other (non HMO) | Admitting: Family Medicine

## 2012-11-15 ENCOUNTER — Telehealth: Payer: Self-pay | Admitting: Internal Medicine

## 2012-11-15 NOTE — Telephone Encounter (Signed)
PT is requesting to switch PCP from Dr. Cato Mulligan to Dr. Kirtland Bouchard. May I schedule?

## 2012-11-16 ENCOUNTER — Encounter: Payer: Self-pay | Admitting: Family Medicine

## 2012-11-16 ENCOUNTER — Ambulatory Visit (INDEPENDENT_AMBULATORY_CARE_PROVIDER_SITE_OTHER): Payer: Managed Care, Other (non HMO) | Admitting: Family Medicine

## 2012-11-16 VITALS — BP 130/98 | Temp 99.1°F | Wt 184.0 lb

## 2012-11-16 DIAGNOSIS — M109 Gout, unspecified: Secondary | ICD-10-CM

## 2012-11-16 DIAGNOSIS — N259 Disorder resulting from impaired renal tubular function, unspecified: Secondary | ICD-10-CM

## 2012-11-16 MED ORDER — PREDNISONE 10 MG PO TABS: ORAL_TABLET | ORAL | Status: DC

## 2012-11-16 NOTE — Progress Notes (Signed)
Chief Complaint  Patient presents with  . Gout    right great toe; since Friday evening     HPI:  Acute visit for "Gout": -has one kidney; followed by nephrology -has had several flares of milder pain, a little redness, in MTP joints feet and ankles in the past relieved with OTC NSAID - but reports not supposed to take any nsaids per his nephrologist -this flare started 5 days ago in R MTP -no fevers, chills, malaise ROS: See pertinent positives and negatives per HPI.  Past Medical History  Diagnosis Date  . Hyperlipidemia   . Hypertension   . Renal impairment     has 1 functioning kidney  . Hx of ulcerative colitis 2003    Family History  Problem Relation Age of Onset  . Cancer Mother     GI  . Hyperlipidemia Mother   . Stomach cancer Mother     lymphoma  . COPD Father   . Heart disease Father     MI  . Colon cancer Neg Hx     History   Social History  . Marital Status: Married    Spouse Name: N/A    Number of Children: N/A  . Years of Education: N/A   Social History Main Topics  . Smoking status: Never Smoker   . Smokeless tobacco: Never Used  . Alcohol Use: 12.6 oz/week    21 Glasses of wine per week  . Drug Use: No  . Sexually Active: None   Other Topics Concern  . None   Social History Narrative  . None    Current outpatient prescriptions:fish oil-omega-3 fatty acids 1000 MG capsule, Take 1 g by mouth daily., Disp: , Rfl: ;  simvastatin (ZOCOR) 40 MG tablet, Take 1 tablet (40 mg total) by mouth every evening., Disp: 90 tablet, Rfl: 0;  telmisartan (MICARDIS) 80 MG tablet, Take 80 mg by mouth daily., Disp: , Rfl: ;  metroNIDAZOLE (METROGEL) 1 % gel, , Disp: , Rfl:  predniSONE (DELTASONE) 10 MG tablet, 40mg  (4 tablets) daily for 3 days, then 30mg (3 tablets) daily for 3 days, then 20mg  (2 tablets)daily for 3 days, then 10mg  (1 tablet) daily for 3 days, Disp: 30 tablet, Rfl: 0  EXAM:  Filed Vitals:   11/16/12 0804  BP: 130/98  Temp: 99.1 F (37.3  C)    Body mass index is 23.61 kg/(m^2).  GENERAL: vitals reviewed and listed above, alert, oriented, appears well hydrated and in no acute distress  HEENT: atraumatic, conjunttiva clear, no obvious abnormalities on inspection of external nose and ears  NECK: no obvious masses on inspection  MS: moves all extremities without noticeable abnormality - R first MTP joint mildly red and TTP  PSYCH: pleasant and cooperative, no obvious depression or anxiety  ASSESSMENT AND PLAN:  Discussed the following assessment and plan:  Gout attack - Plan: predniSONE (DELTASONE) 10 MG tablet, Uric Acid  RENAL INSUFFICIENCY  -discussed likely dx of gout, tx options -will do prednisone taper given renal issues -advised checking uric acid (though explained may be neg in acute gout) but supports dx if up -advised to discuss nsaid use with his nephorlogist -follow up with PCP if recurs -Patient advised to return or notify a doctor immediately if symptoms worsen or persist or new concerns arise.  Patient Instructions  -As we discussed, we have prescribed a new medication (prdnisone)for you at this appointment. We discussed the common and serious potential adverse effects of this medication and you can review  these and more with the pharmacist when you pick up your medication.  Please follow the instructions for use carefully and notify us immediately if you have any problems taking this medication.  -discuss this diagnosis with your nephrologist and ensure safe to take   -if you have another flare of gout please see your doctor       Terressa Koyanagi.

## 2012-11-16 NOTE — Patient Instructions (Signed)
-  As we discussed, we have prescribed a new medication (prdnisone)for you at this appointment. We discussed the common and serious potential adverse effects of this medication and you can review these and more with the pharmacist when you pick up your medication.  Please follow the instructions for use carefully and notify us immediately if you have any problems taking this medication.  -discuss this diagnosis with your nephrologist and ensure safe to take   -if you have another flare of gout please see your doctor

## 2012-11-16 NOTE — Telephone Encounter (Signed)
ok 

## 2012-11-18 NOTE — Telephone Encounter (Signed)
ok 

## 2012-11-18 NOTE — Telephone Encounter (Signed)
lmovm /ga °

## 2012-11-21 ENCOUNTER — Telehealth: Payer: Self-pay | Admitting: *Deleted

## 2012-11-21 MED ORDER — SIMVASTATIN 40 MG PO TABS: 40.0000 mg | ORAL_TABLET | Freq: Every evening | ORAL | Status: DC

## 2012-11-23 ENCOUNTER — Telehealth: Payer: Self-pay | Admitting: Family Medicine

## 2012-11-23 NOTE — Telephone Encounter (Signed)
Pt states he is NO better w/ his gout.  Pt is in a lot of pain. Pt has finished all meds and needs another round of meds asap!! Pt does not understand why he is not better!! This is going on 2 weeks!  Pls advise. [facility]: [facility]  (not the one on file)

## 2012-11-24 ENCOUNTER — Other Ambulatory Visit: Payer: Self-pay | Admitting: Family Medicine

## 2012-11-24 NOTE — Telephone Encounter (Signed)
Called and left a vm for pt to return call;   See previous note.

## 2012-11-24 NOTE — Telephone Encounter (Signed)
Pt has scheduled appt for 11/25/12

## 2012-11-24 NOTE — Telephone Encounter (Signed)
PT is calling to inquire about this matter again. He states that his severe symptoms have only gotten worse, and he would like an RX immediately. He states that he is willing to come in again if he needs to, or to have labs completed like Dr. Selena Batten has suggested previously. There are currently no lab orders to schedule though. Please assist.

## 2012-11-24 NOTE — Telephone Encounter (Signed)
If gout no better needs to be seen, should check uric acid. Should see his PCP  Called and left a vm for pt to return call; per Dr. Selena Batten pt can come back in for a f/u appt, see orthopedics, or rheumatology. Pt could come and get labs if the insist.

## 2012-11-24 NOTE — Telephone Encounter (Signed)
If gout no better needs to be seen, should check uric acid. Should see his PCP.

## 2012-11-25 ENCOUNTER — Ambulatory Visit (INDEPENDENT_AMBULATORY_CARE_PROVIDER_SITE_OTHER): Payer: Managed Care, Other (non HMO) | Admitting: Family Medicine

## 2012-11-25 ENCOUNTER — Encounter: Payer: Self-pay | Admitting: Family Medicine

## 2012-11-25 VITALS — BP 130/82 | Temp 98.1°F | Wt 184.0 lb

## 2012-11-25 DIAGNOSIS — M19079 Primary osteoarthritis, unspecified ankle and foot: Secondary | ICD-10-CM

## 2012-11-25 LAB — URIC ACID: Uric Acid, Serum: 8.3 mg/dL — ABNORMAL HIGH (ref 4.0–7.8)

## 2012-11-25 MED ORDER — HYDROCODONE-ACETAMINOPHEN 5-325 MG PO TABS: 1.0000 | ORAL_TABLET | Freq: Four times a day (QID) | ORAL | Status: DC | PRN

## 2012-11-25 NOTE — Patient Instructions (Signed)
-  see Dr. Charlann Boxer at [facility] today at 3:30  -do not take NSAIDS per your kidney doctor recommendations  -use pain medication per instructions

## 2012-11-25 NOTE — Progress Notes (Signed)
Chief Complaint  Patient presents with  . right foot pain    HPI:  Devon Taylor, a 52 yo M patient of Dr. Cato Mulligan with PMH hx sig for one kidney followed by nephrology, is here for follow up for R great toe pain: -seen 8//14 and treated for presumed gout with prednisone with 12 day taper - he reports improved a little but not much on prednisone but he accelerated the course and finished in 7 days and then symptoms returned; he took tramadol nephrologist gave him (nephrologist does not want him to take nsaids) -he has been taking tramadol and nsaids despite renal recs due to severity of pain -denies: fevers, chills, NVD, spreading of redness up leg  ROS: See pertinent positives and negatives per HPI.  Past Medical History  Diagnosis Date  . Hyperlipidemia   . Hypertension   . Renal impairment     has 1 functioning kidney  . Hx of ulcerative colitis 2003    Family History  Problem Relation Age of Onset  . Cancer Mother     GI  . Hyperlipidemia Mother   . Stomach cancer Mother     lymphoma  . COPD Father   . Heart disease Father     MI  . Colon cancer Neg Hx     History   Social History  . Marital Status: Married    Spouse Name: N/A    Number of Children: N/A  . Years of Education: N/A   Social History Main Topics  . Smoking status: Never Smoker   . Smokeless tobacco: Never Used  . Alcohol Use: 12.6 oz/week    21 Glasses of wine per week  . Drug Use: No  . Sexual Activity: None   Other Topics Concern  . None   Social History Narrative  . None    Current outpatient prescriptions:fish oil-omega-3 fatty acids 1000 MG capsule, Take 1 g by mouth daily., Disp: , Rfl: ;  metroNIDAZOLE (METROGEL) 1 % gel, , Disp: , Rfl: ;  simvastatin (ZOCOR) 40 MG tablet, Take 1 tablet (40 mg total) by mouth every evening., Disp: 90 tablet, Rfl: 0;  telmisartan (MICARDIS) 80 MG tablet, Take 80 mg by mouth daily., Disp: , Rfl:  HYDROcodone-acetaminophen (NORCO) 5-325 MG per tablet,  Take 1 tablet by mouth every 6 (six) hours as needed for pain., Disp: 20 tablet, Rfl: 0;  predniSONE (DELTASONE) 10 MG tablet, 40mg  (4 tablets) daily for 3 days, then 30mg (3 tablets) daily for 3 days, then 20mg  (2 tablets)daily for 3 days, then 10mg  (1 tablet) daily for 3 days, Disp: 30 tablet, Rfl: 0  EXAM:  Filed Vitals:   11/25/12 0851  BP: 130/82  Temp: 98.1 F (36.7 C)    Body mass index is 23.61 kg/(m^2).  GENERAL: vitals reviewed and listed above, alert, oriented, appears well hydrated and in no acute distress  HEENT: atraumatic, conjunttiva clear, no obvious abnormalities on inspection of external nose and ears  NECK: no obvious masses on inspection  LUNGS: clear to auscultation bilaterally, no wheezes, rales or rhonchi, good air movement  CV: HRRR, no peripheral edema  MS: moves all extremities without noticeable abnormality - redness, warmth, pain, decreased ROM 1st MTP joint of R foot due to pain  PSYCH: pleasant and cooperative, no obvious depression or anxiety  ASSESSMENT AND PLAN:  Discussed the following assessment and plan:  Arthritis of first MTP joint - Plan: Uric Acid, HYDROcodone-acetaminophen (NORCO) 5-325 MG per tablet  -uric acid today to  help support dx of suspected gout -however, given poor response to prednisone, ortho referral (ortho, Dr. Charlann Boxer at [facility],  will see him today at 3:30 - appt details provided) after discussion other potential etiologies and may need tap of this joint before preceding with further treatment -pain medication to help until sees ortho -Patient advised to return or notify a doctor immediately if symptoms worsen or persist or new concerns arise.  There are no Patient Instructions on file for this visit.   Kriste Basque R.

## 2012-11-30 ENCOUNTER — Telehealth: Payer: Self-pay | Admitting: Internal Medicine

## 2012-11-30 NOTE — Telephone Encounter (Signed)
Pt seen by Dr Selena Batten 8/15. Labs done.  Would like a call w/ results.

## 2012-11-30 NOTE — Telephone Encounter (Signed)
Follow up with Dr. Cato Mulligan in 1-2 months.  Per Dr. Selena Batten pt's uric acid level was elevated at 8.3.  Labs were faxed to Orthopedics on 11/25/12.  Left a message for return call.

## 2012-12-01 NOTE — Telephone Encounter (Signed)
Left a detailed message for pt at personalized cell phone number.

## 2012-12-27 ENCOUNTER — Encounter: Payer: Managed Care, Other (non HMO) | Admitting: Vascular Surgery

## 2013-01-18 ENCOUNTER — Inpatient Hospital Stay (HOSPITAL_COMMUNITY): Admission: RE | Admit: 2013-01-18 | Payer: Managed Care, Other (non HMO) | Source: Ambulatory Visit

## 2013-01-18 ENCOUNTER — Encounter: Payer: Managed Care, Other (non HMO) | Admitting: Vascular Surgery

## 2013-03-06 ENCOUNTER — Other Ambulatory Visit: Payer: Self-pay | Admitting: *Deleted

## 2013-03-06 MED ORDER — SIMVASTATIN 40 MG PO TABS: 40.0000 mg | ORAL_TABLET | Freq: Every evening | ORAL | Status: DC

## 2013-06-14 ENCOUNTER — Telehealth: Payer: Self-pay | Admitting: Internal Medicine

## 2013-06-14 DIAGNOSIS — E785 Hyperlipidemia, unspecified: Secondary | ICD-10-CM

## 2013-06-14 NOTE — Telephone Encounter (Signed)
Pt is currently taking rx simvastatin (ZOCOR) 40 MG tablet, pt states that one of the side effects is joint pain, and he has been experiencing really bad joint pain. Pt states he stopped taking the meds and the joint pain went away. Pt wants to know if there is another rx that can be called in for him. Send to [facility] delivery [facility].

## 2013-06-14 NOTE — Telephone Encounter (Signed)
Stop simvastatin Start lipitor 20 mg po qd, #90/ 3 refills Check liver and lipid panel in 3 months

## 2013-06-15 MED ORDER — ATORVASTATIN CALCIUM 20 MG PO TABS: 20.0000 mg | ORAL_TABLET | Freq: Every day | ORAL | Status: DC

## 2013-06-15 NOTE — Telephone Encounter (Signed)
rx sent in electronically to [facility], pt aware of Dr Leanne Chang advice.  He will call back to schedule lab appt when he gets his medicine.  Allergy list updated

## 2013-09-15 ENCOUNTER — Other Ambulatory Visit: Payer: Self-pay | Admitting: Internal Medicine

## 2013-09-18 ENCOUNTER — Ambulatory Visit (INDEPENDENT_AMBULATORY_CARE_PROVIDER_SITE_OTHER)
Admission: RE | Admit: 2013-09-18 | Discharge: 2013-09-18 | Disposition: A | Discharge status: Home / Self Care | Payer: Managed Care, Other (non HMO) | Source: Ambulatory Visit | Attending: Physician Assistant | Admitting: Physician Assistant

## 2013-09-18 ENCOUNTER — Encounter: Payer: Self-pay | Admitting: Physician Assistant

## 2013-09-18 ENCOUNTER — Telehealth: Payer: Self-pay | Admitting: Physician Assistant

## 2013-09-18 ENCOUNTER — Ambulatory Visit (INDEPENDENT_AMBULATORY_CARE_PROVIDER_SITE_OTHER): Payer: Managed Care, Other (non HMO) | Admitting: Physician Assistant

## 2013-09-18 VITALS — BP 138/86 | HR 84 | Temp 98.9°F | Resp 18 | Wt 185.0 lb

## 2013-09-18 DIAGNOSIS — S8990XA Unspecified injury of unspecified lower leg, initial encounter: Secondary | ICD-10-CM

## 2013-09-18 DIAGNOSIS — S99919A Unspecified injury of unspecified ankle, initial encounter: Secondary | ICD-10-CM

## 2013-09-18 DIAGNOSIS — S99929A Unspecified injury of unspecified foot, initial encounter: Secondary | ICD-10-CM

## 2013-09-18 MED ORDER — PREDNISONE 20 MG PO TABS: 20.0000 mg | ORAL_TABLET | Freq: Two times a day (BID) | ORAL | Status: DC

## 2013-09-18 NOTE — Telephone Encounter (Signed)
Patient called about x-ray results from left ankle. Normal x-ray. Patient will continue plan with prednisone and RICE therapy. Followup as needed, or for worsening or persistent symptoms.

## 2013-09-18 NOTE — Progress Notes (Signed)
Subjective:    Patient ID: Devon Taylor, male    DOB: 1960/11/10, 53 y.o.   MRN: [id number]  Ankle Injury  The incident occurred 5 to 7 days ago. [place]ident [place]. The injury mechanism was an inversion injury and an eversion injury. The pain is present in the left ankle. The quality of the pain is described as burning and stabbing. The pain is at a severity of 10/10. The pain is severe. The pain has been constant since onset. Associated symptoms include an inability to bear weight and a loss of motion. Pertinent negatives include no loss of sensation, muscle weakness, numbness or tingling. He reports no foreign bodies present. The symptoms are aggravated by weight bearing, palpation and movement. He has tried elevation, ice, rest, non-weight bearing and acetaminophen for the symptoms. The treatment provided no relief.      Review of Systems  Constitutional: Negative for fever and chills.  Respiratory: Negative for shortness of breath.   Gastrointestinal: Negative for nausea, vomiting and diarrhea.  Musculoskeletal: Positive for gait problem (limp) and joint swelling.  Neurological: Negative for tingling and numbness.  All other systems reviewed and are negative.     Past Medical History  Diagnosis Date  . Hyperlipidemia   . Hypertension   . Renal impairment     has 1 functioning kidney  . Hx of ulcerative colitis 2003   Past Surgical History  Procedure Laterality Date  . Umbilical hernia repair  12/10    reports that he has never smoked. He has never used smokeless tobacco. He reports that he drinks about 12.6 ounces of alcohol per week. He reports that he does not use illicit drugs. family history includes COPD in his father; Cancer in his mother; Heart disease in his father; Hyperlipidemia in his mother; Stomach cancer in his mother. There is no history of Colon cancer. Allergies  Allergen Reactions  . Ace Inhibitors     REACTION: cough with enalapril  .  Nsaids     Renal Insufficiency---he has been advised to avoid all NSAIDs  . Simvastatin Other (See Comments)    Muscle aches       Objective:   Physical Exam  Nursing note and vitals reviewed. Constitutional: He is oriented to person, place, and time. He appears well-developed and well-nourished. No distress.  HENT:  Head: Normocephalic and atraumatic.  Eyes: Conjunctivae and EOM are normal. Pupils are equal, round, and reactive to light.  Neck: Normal range of motion.  Cardiovascular: Normal rate, regular rhythm, normal heart sounds and intact distal pulses.  Exam reveals no gallop and no friction rub.   No murmur heard. Pulmonary/Chest: Effort normal and breath sounds normal. No respiratory distress. He has no wheezes. He has no rales. He exhibits no tenderness.  Musculoskeletal: Normal range of motion. He exhibits tenderness. He exhibits no edema.  Tenderness to palpation over the medial arch of the left foot.  There is palpation over the lateral malleolus of the left ankle. Painful range of motion of the left ankle. Gait: Limp favoring the left.  Neurological: He is alert and oriented to person, place, and time.  Skin: Skin is warm and dry. No rash noted. He is not diaphoretic. No erythema. No pallor.  Psychiatric: He has a normal mood and affect. His behavior is normal. Judgment and thought content normal.    Filed Vitals:   09/18/13 1509  BP: 138/86  Pulse: 84  Temp: 98.9 F (37.2 C)  Resp: 18  Lab Results  Component Value Date   WBC 5.2 03/20/2011   HGB 15.4 03/20/2011   HCT 45.3 03/20/2011   PLT 226.0 03/20/2011   GLUCOSE 90 06/15/2011   CHOL 194 03/20/2011   TRIG 224.0* 03/20/2011   HDL 46.60 03/20/2011   LDLDIRECT 95.0 03/20/2011   LDLCALC 106* 02/18/2009   ALT 32 03/20/2011   AST 26 03/20/2011   NA 137 06/15/2011   K 4.7 06/15/2011   CL 100 06/15/2011   CREATININE 2.5* 06/15/2011   BUN 25* 06/15/2011   CO2 30 06/15/2011   TSH 1.69 03/20/2011   PSA 1.86 03/20/2011         Assessment & Plan:  Devon Taylor was seen today for left ankle sprain.  Diagnoses and associated orders for this visit:  Ankle injury Comments: Persistent pain despite RICE therapy. 1 kidney, so no NSAIDs. Will use prednisone. Will get Left ankle series to rule out Fx. - DG Ankle Complete Left; Future - predniSONE (DELTASONE) 20 MG tablet; Take 1 tablet (20 mg total) by mouth 2 (two) times daily with a meal.    Plan may change depending on left ankle plain films.  Plan to followup as needed, or for worsening or persistent symptoms despite treatment.  Patient Instructions  Prednisone twice a day with a male for one week for anti-inflammation due to ankle injury.  Continue rice therapy (rest, ice, compression, elevation.)  You will have plain x-rays done of your left ankle today at the [facility] office, we will call you with the results of these when they're available and any change in plan.  Soft tissue injuries can take several weeks to fully heal, followup is usually unnecessary unless pain worsens, or there is re-injury, or the x-rays show fracture on initial imaging.  Followup as needed, or for worsening or persistent symptoms despite treatment.

## 2013-09-18 NOTE — Progress Notes (Signed)
Pre visit review using our clinic review tool, if applicable. No additional management support is needed unless otherwise documented below in the visit note. 

## 2013-09-18 NOTE — Patient Instructions (Signed)
Prednisone twice a day with a male for one week for anti-inflammation due to ankle injury.  Continue rice therapy (rest, ice, compression, elevation.)  You will have plain x-rays done of your left ankle today at the [facility] office, we will call you with the results of these when they're available and any change in plan.  Soft tissue injuries can take several weeks to fully heal, followup is usually unnecessary unless pain worsens, or there is re-injury, or the x-rays show fracture on initial imaging.  Followup as needed, or for worsening or persistent symptoms despite treatment.    RICE: Routine Care for Injuries Rest, Ice, Compression, and Elevation (RICE) are often used to care for injuries. HOME CARE  Rest your injury.  Put ice on the injury.  Put ice in a plastic bag.  Place a towel between your skin and the bag.  Leave the ice on for 15-20 minutes, 03-04 times a day. Do this for as long as told by your doctor.  Apply pressure (compression) with an elastic bandage. Remove and reapply the bandage every 3 to 4 hours. Do not wrap the bandage too tight. Wrap the bandage looser if the fingers or toes are puffy (swollen), blue, cold, painful, or lose feeling (numb).  Raise (elevate) your injury. Raise your injury above the heart if you can. GET HELP RIGHT AWAY IF:  You have lasting pain or puffiness.  Your injury is red, weak, or loses feeling.  Your problems get worse, not better, after several days. MAKE SURE YOU:  Understand these instructions.  Will watch your condition.  Will get help right away if you are not doing well or get worse. Document Released: 09/16/2007 Document Revised: 06/22/2011 Document Reviewed: 08/29/2010 [facility] Patient Information 2014 [place], [place].

## 2013-09-25 ENCOUNTER — Telehealth: Payer: Self-pay | Admitting: Internal Medicine

## 2013-09-25 DIAGNOSIS — M79673 Pain in unspecified foot: Secondary | ICD-10-CM

## 2013-09-25 NOTE — Telephone Encounter (Signed)
Ok per Rodman Key to send to Sports Medicine.  Referral placed.  Called and spoke with pt and pt is aware.

## 2013-09-25 NOTE — Telephone Encounter (Signed)
Pt calling back as instructed if not better. Pt still having pain in left ankle.pt was rx  Pt given predniSONE (DELTASONE) 20 MG tablet pls advise [facility] [address]

## 2013-09-27 ENCOUNTER — Ambulatory Visit: Payer: Managed Care, Other (non HMO) | Admitting: Family Medicine

## 2013-10-27 ENCOUNTER — Ambulatory Visit: Payer: Managed Care, Other (non HMO) | Admitting: Family Medicine

## 2014-02-09 ENCOUNTER — Encounter: Payer: Self-pay | Admitting: Internal Medicine

## 2014-02-09 ENCOUNTER — Ambulatory Visit (INDEPENDENT_AMBULATORY_CARE_PROVIDER_SITE_OTHER): Payer: Managed Care, Other (non HMO) | Admitting: Internal Medicine

## 2014-02-09 VITALS — BP 147/82 | HR 87 | Temp 98.2°F | Ht 73.0 in | Wt 183.0 lb

## 2014-02-09 DIAGNOSIS — M109 Gout, unspecified: Secondary | ICD-10-CM | POA: Insufficient documentation

## 2014-02-09 DIAGNOSIS — M1000 Idiopathic gout, unspecified site: Secondary | ICD-10-CM

## 2014-02-09 DIAGNOSIS — N182 Chronic kidney disease, stage 2 (mild): Secondary | ICD-10-CM

## 2014-02-09 DIAGNOSIS — Z23 Encounter for immunization: Secondary | ICD-10-CM

## 2014-02-09 DIAGNOSIS — E785 Hyperlipidemia, unspecified: Secondary | ICD-10-CM

## 2014-02-09 DIAGNOSIS — I10 Essential (primary) hypertension: Secondary | ICD-10-CM

## 2014-02-09 MED ORDER — PREDNISONE 10 MG PO TABS: ORAL_TABLET | ORAL | Status: DC

## 2014-02-09 NOTE — Assessment & Plan Note (Addendum)
History of gout with occasional Viagra. knows to avoid Motrin or similar medication. Request a prescription for prednisone to use when necessary. Plan: Uric acid, prednisone prescription provided, to be use only if he is certain has a gout attack

## 2014-02-09 NOTE — Assessment & Plan Note (Signed)
Continue with Micardis, check a BMP

## 2014-02-09 NOTE — Progress Notes (Signed)
Subjective:    Patient ID: Devon Taylor, male    DOB: Aug 27, 1960, 53 y.o.   MRN: [id number]  DOS:  02/09/2014 Type of visit - description : transferring  Interval history: High blood pressure, pulmonary, BP today is satisfactory. High cholesterol, self discontinued Lipitor, does not like to take too many medications. Chronic kidney disease, sees the nephrologist regularly. Creatinine usually 1.7, 1.9. History of gout, has episodes sporadically, knows to avoid motrin or similar medications, sometimes gets prednisone     ROS Denies chest pain or difficulty breathing No nausea, vomiting, diarrhea. No lower extremity edema  Past Medical History  Diagnosis Date  . Hyperlipidemia   . Hypertension   . Renal impairment     has 1 functioning kidney  . Hx of ulcerative colitis 2003    silent x a while  . Gout     Past Surgical History  Procedure Laterality Date  . Umbilical hernia repair  12/10    History   Social History  . Marital Status: Married    Spouse Name: N/A    Number of Children: 2  . Years of Education: N/A   Occupational History  . works at [employer]  . Smoking status: Never Smoker   . Smokeless tobacco: Never Used  . Alcohol Use: 12.6 oz/week    21 Glasses of wine per week  . Drug Use: No  . Sexual Activity: Not on file   Other Topics Concern  . Not on file   Social History Narrative   2 daughters, adults    Fluid in Spanish     Family History  Problem Relation Age of Onset  . Cancer Mother     bladder   . Hyperlipidemia Mother   . Stomach cancer Mother     lymphoma  . COPD Father   . Heart disease Father     MI  . Colon cancer Neg Hx       Medication List       This list is accurate as of: 02/09/14 11:59 PM.  Always use your most recent med list.               CHERRY PO  Take 4 tablets by mouth daily. 200 mg each tablet     fish oil-omega-3 fatty acids 1000 MG capsule  Take 1 g by mouth daily.       predniSONE 10 MG tablet  Commonly known as:  DELTASONE  - If you have have a gout episode:  - 3 tablets daily for 2 days, 2 tablets a day for the next 2 days, 1 tablet daily for the next 2 days.     telmisartan 80 MG tablet  Commonly known as:  MICARDIS  Take 80 mg by mouth daily.           Objective:   Physical Exam BP 147/82  Pulse 87  Temp(Src) 98.2 F (36.8 C) (Oral)  Ht 6\' 1"  (1.854 m)  Wt 183 lb (83.008 kg)  BMI 24.15 kg/m2  SpO2 98% General -- alert, well-developed, NAD.  HEENT-- Not pale.  Lungs -- normal respiratory effort, no intercostal retractions, no accessory muscle use, and normal breath sounds.  Heart-- normal rate, regular rhythm, no murmur.   Extremities-- no pretibial edema bilaterally  Neurologic--  alert & oriented X3. Speech normal, gait appropriate for age, strength symmetric and appropriate for age.    Psych-- Cognition and judgment appear intact. Cooperative  with normal attention span and concentration. No anxious or depressed appearing.       Assessment & Plan:     Last Td  2005, will provide one when he comes back Flu shot--declined Pneumonia shot--today

## 2014-02-09 NOTE — Progress Notes (Signed)
Pre visit review using our clinic review tool, if applicable. No additional management support is needed unless otherwise documented below in the visit note. 

## 2014-02-09 NOTE — Patient Instructions (Signed)
   Stop by the front desk and schedule labs to be done within few days (fasting)   Please come back to the office 6 months  for a physical exam. Come back fasting

## 2014-02-09 NOTE — Assessment & Plan Note (Signed)
Follow-up by nephrology, creatinine is usually 1.7, 1.9

## 2014-02-09 NOTE — Assessment & Plan Note (Signed)
Self discontinued Lipitor, check labs

## 2014-02-10 ENCOUNTER — Encounter: Payer: Self-pay | Admitting: Internal Medicine

## 2014-02-14 ENCOUNTER — Other Ambulatory Visit: Payer: Managed Care, Other (non HMO)

## 2014-02-16 ENCOUNTER — Other Ambulatory Visit (INDEPENDENT_AMBULATORY_CARE_PROVIDER_SITE_OTHER): Payer: Managed Care, Other (non HMO)

## 2014-02-16 DIAGNOSIS — E785 Hyperlipidemia, unspecified: Secondary | ICD-10-CM

## 2014-02-16 DIAGNOSIS — M1000 Idiopathic gout, unspecified site: Secondary | ICD-10-CM

## 2014-02-16 DIAGNOSIS — N182 Chronic kidney disease, stage 2 (mild): Secondary | ICD-10-CM

## 2014-02-16 LAB — BASIC METABOLIC PANEL
BUN: 30 mg/dL — ABNORMAL HIGH (ref 6–23)
CALCIUM: 9.8 mg/dL (ref 8.4–10.5)
CO2: 26 mEq/L (ref 19–32)
CREATININE: 2 mg/dL — AB (ref 0.4–1.5)
Chloride: 102 mEq/L (ref 96–112)
GFR: 36.62 mL/min — AB (ref >60.00)
GLUCOSE: 79 mg/dL (ref 70–99)
POTASSIUM: 4.5 meq/L (ref 3.5–5.1)
Sodium: 137 mEq/L (ref 135–145)

## 2014-02-16 LAB — LIPID PANEL
CHOL/HDL RATIO: 6
Cholesterol: 293 mg/dL — ABNORMAL HIGH (ref 0–200)
HDL: 48.9 mg/dL (ref >39.00)
LDL CALC: 209 mg/dL — AB (ref 0–99)
NonHDL: 244.1
Triglycerides: 175 mg/dL — ABNORMAL HIGH (ref 0.0–149.0)
VLDL: 35 mg/dL (ref 0.0–40.0)

## 2014-02-16 LAB — URIC ACID: URIC ACID, SERUM: 11.5 mg/dL — AB (ref 4.0–7.8)

## 2014-02-16 LAB — AST: AST: 20 U/L (ref 0–37)

## 2014-02-16 LAB — ALT: ALT: 22 U/L (ref 0–53)

## 2014-02-20 ENCOUNTER — Telehealth: Payer: Self-pay | Admitting: Internal Medicine

## 2014-02-20 DIAGNOSIS — E785 Hyperlipidemia, unspecified: Secondary | ICD-10-CM

## 2014-02-20 NOTE — Telephone Encounter (Signed)
Pt would like for you to give him a call, states he has questions regarding his lab results specifically cholesterol and ulric acid. Also has questions regarding his gout.

## 2014-02-20 NOTE — Telephone Encounter (Signed)
Please advise 

## 2014-02-20 NOTE — Telephone Encounter (Signed)
Advise patient: Kidney function stable. Cholesterol has definitely increased, recommend to go back on Lipitor 20 mg one by mouth daily at bedtime. Uric acid (gout tests) elevated as expected. Schedule labs 2 months from today: FLP, AST, ALT high cholesterol

## 2014-02-21 MED ORDER — ATORVASTATIN CALCIUM 20 MG PO TABS: 20.0000 mg | ORAL_TABLET | Freq: Every day | ORAL | Status: DC

## 2014-02-21 NOTE — Telephone Encounter (Signed)
LMOM for Pt to return call regarding lab results.  

## 2014-02-21 NOTE — Telephone Encounter (Signed)
LMOM for Pt informing him of lab results, instructed him to begin Lipitor 20 mg 1 tablet at bedtime. Informed him to return in 2 months for FLP, AST, ALT.

## 2014-02-21 NOTE — Telephone Encounter (Signed)
Patient returned phone call. Best # [phone]

## 2014-02-22 ENCOUNTER — Telehealth: Payer: Self-pay | Admitting: Internal Medicine

## 2014-02-22 NOTE — Telephone Encounter (Signed)
Returning your call. °

## 2014-02-22 NOTE — Telephone Encounter (Signed)
Spoke with Pt, informed him of lab results. He expressed concerns about the elevated Uric Acid level at 11.5, he already has list of foods that cause gout flare ups and has been avoiding high risk foods, but is wondering if there is any medication he can take to bring down his uric acid levels. He also informed me that he is still having pain in his feet, he only has 2 tablets of prednisone left and is wondering if he can get another round of prednisone?

## 2014-02-22 NOTE — Telephone Encounter (Signed)
See previous phone note.  

## 2014-02-23 MED ORDER — PREDNISONE 10 MG PO TABS: ORAL_TABLET | ORAL | Status: DC

## 2014-02-23 NOTE — Addendum Note (Signed)
Addended by: Kathlene November E on: 02/23/2014 05:25 PM   Modules accepted: Orders

## 2014-02-23 NOTE — Telephone Encounter (Signed)
Spoke with the patient, uric acid level is high but he has episodes every 3-4 months, we talk about possibly allopurinol versus prednisone as needed, he elected prednisone. A new prescription is sent, currently feeling well

## 2014-03-06 ENCOUNTER — Telehealth: Payer: Self-pay | Admitting: Internal Medicine

## 2014-03-06 NOTE — Telephone Encounter (Signed)
Spoke with Pt, he was calling regarding Prednisone medication refills. Informed him Dr. Larose Kells sent  # 57 with [facility] [address]. Informed Pt to just take Prednisone if he has flare up of gout. Pt verbalized understanding.

## 2014-03-06 NOTE — Telephone Encounter (Signed)
Caller name:Lucinda Dell, [name] Relation to EF:UWTK Call back number:[phone] Pharmacy:  Reason for call: pt states he spoke with dr. Larose Kells in regards to setting up refills for him for prednisone, states he would like for you to call him back to discuss further.

## 2014-07-27 ENCOUNTER — Telehealth: Payer: Self-pay | Admitting: Internal Medicine

## 2014-07-27 NOTE — Telephone Encounter (Signed)
Pre Visit letter sent  °

## 2014-08-10 ENCOUNTER — Telehealth: Payer: Self-pay | Admitting: Internal Medicine

## 2014-08-10 NOTE — Telephone Encounter (Signed)
FYI

## 2014-08-10 NOTE — Telephone Encounter (Signed)
Relation to pt: self  Call back number:[phone]   Reason for call:  Pt requesting creatinine lab orders due to pt having one kidney. Would like to labs drawn at the time of CPE appointment 08/15/14. Please advise

## 2014-08-10 NOTE — Telephone Encounter (Signed)
That is ok, will do

## 2014-08-14 ENCOUNTER — Telehealth: Payer: Self-pay | Admitting: *Deleted

## 2014-08-14 NOTE — Telephone Encounter (Signed)
Unable to reach patient at time of Pre-Visit Call.  Left message for patient to return call when available.    

## 2014-08-15 ENCOUNTER — Ambulatory Visit (INDEPENDENT_AMBULATORY_CARE_PROVIDER_SITE_OTHER): Payer: Managed Care, Other (non HMO) | Admitting: Internal Medicine

## 2014-08-15 ENCOUNTER — Encounter: Payer: Self-pay | Admitting: Internal Medicine

## 2014-08-15 VITALS — BP 116/64 | HR 67 | Temp 98.0°F | Ht 73.0 in | Wt 181.0 lb

## 2014-08-15 DIAGNOSIS — N183 Chronic kidney disease, stage 3 unspecified: Secondary | ICD-10-CM

## 2014-08-15 DIAGNOSIS — Z0000 Encounter for general adult medical examination without abnormal findings: Secondary | ICD-10-CM | POA: Diagnosis not present

## 2014-08-15 DIAGNOSIS — Z23 Encounter for immunization: Secondary | ICD-10-CM | POA: Diagnosis not present

## 2014-08-15 DIAGNOSIS — E785 Hyperlipidemia, unspecified: Secondary | ICD-10-CM

## 2014-08-15 LAB — CBC WITH DIFFERENTIAL/PLATELET
Basophils Absolute: 0 10*3/uL (ref 0.0–0.1)
Basophils Relative: 0.4 % (ref 0.0–3.0)
EOS ABS: 0.2 10*3/uL (ref 0.0–0.7)
Eosinophils Relative: 3.1 % (ref 0.0–5.0)
HCT: 43.3 % (ref 39.0–52.0)
Hemoglobin: 14.8 g/dL (ref 13.0–17.0)
Lymphocytes Relative: 18.4 % (ref 12.0–46.0)
Lymphs Abs: 1.3 10*3/uL (ref 0.7–4.0)
MCHC: 34.3 g/dL (ref 30.0–36.0)
MCV: 88.8 fl (ref 78.0–100.0)
MONO ABS: 0.5 10*3/uL (ref 0.1–1.0)
Monocytes Relative: 6.7 % (ref 3.0–12.0)
NEUTROS ABS: 5.1 10*3/uL (ref 1.4–7.7)
NEUTROS PCT: 71.4 % (ref 43.0–77.0)
Platelets: 237 10*3/uL (ref 150.0–400.0)
RBC: 4.87 Mil/uL (ref 4.22–5.81)
RDW: 13.4 % (ref 11.5–15.5)
WBC: 7.1 10*3/uL (ref 4.0–10.5)

## 2014-08-15 LAB — LIPID PANEL
CHOLESTEROL: 189 mg/dL (ref 0–200)
HDL: 54.2 mg/dL (ref >39.00)
LDL Cholesterol: 104 mg/dL — ABNORMAL HIGH (ref 0–99)
NonHDL: 134.8
Total CHOL/HDL Ratio: 3
Triglycerides: 154 mg/dL — ABNORMAL HIGH (ref 0.0–149.0)
VLDL: 30.8 mg/dL (ref 0.0–40.0)

## 2014-08-15 LAB — TSH: TSH: 1.58 u[IU]/mL (ref 0.35–4.50)

## 2014-08-15 LAB — COMPREHENSIVE METABOLIC PANEL
ALBUMIN: 3.8 g/dL (ref 3.5–5.2)
ALT: 22 U/L (ref 0–53)
AST: 20 U/L (ref 0–37)
Alkaline Phosphatase: 54 U/L (ref 39–117)
BUN: 22 mg/dL (ref 6–23)
CO2: 29 meq/L (ref 19–32)
Calcium: 10 mg/dL (ref 8.4–10.5)
Chloride: 105 mEq/L (ref 96–112)
Creatinine, Ser: 1.74 mg/dL — ABNORMAL HIGH (ref 0.40–1.50)
GFR: 43.67 mL/min — AB (ref >60.00)
GLUCOSE: 104 mg/dL — AB (ref 70–99)
POTASSIUM: 4.6 meq/L (ref 3.5–5.1)
SODIUM: 138 meq/L (ref 135–145)
TOTAL PROTEIN: 6.5 g/dL (ref 6.0–8.3)
Total Bilirubin: 0.6 mg/dL (ref 0.2–1.2)

## 2014-08-15 LAB — PSA: PSA: 1.5 ng/mL (ref 0.10–4.00)

## 2014-08-15 MED ORDER — TELMISARTAN 80 MG PO TABS: 80.0000 mg | ORAL_TABLET | Freq: Every day | ORAL | Status: DC

## 2014-08-15 MED ORDER — ATORVASTATIN CALCIUM 20 MG PO TABS: 20.0000 mg | ORAL_TABLET | Freq: Every day | ORAL | Status: DC

## 2014-08-15 NOTE — Assessment & Plan Note (Addendum)
The patient has CKD stage III, has been follow-up by nephrology regularly, but at this point however he is reluctant to continue going yearly. Chart review, his creatinine is around 2.0, he knows very well to avoid nephrotoxins. He has a history of proteinuria but has been reluctant to proceed with a biopsy consequently the etiology is unknown. We agreed to follow-up every 6 months and monitor his kidney function, if there is any deterioration he will be willing to go see nephrology again.

## 2014-08-15 NOTE — Assessment & Plan Note (Addendum)
Td 2005 pnm 23-- 01-2014 prevnar-- 08-2014  Colon cancer screening-- cscope 09-2011, 1 polyp, repeat in 5 years Prostate cancer screening-- DRE neg, check a PSA  Doing well with diet and exercise

## 2014-08-15 NOTE — Progress Notes (Signed)
Pre visit review using our clinic review tool, if applicable. No additional management support is needed unless otherwise documented below in the visit note. 

## 2014-08-15 NOTE — Patient Instructions (Signed)
Get your blood work before you leave   Come back to the office in 6 months   for a routine check up

## 2014-08-15 NOTE — Progress Notes (Signed)
Subjective:    Patient ID: Devon Taylor, male    DOB: [birth year], 54 y.o.   MRN: [id number]  DOS:  08/15/2014 Type of visit - description : cpx Interval history: Doing well, likes to discuss chronic kidney disease   Review of Systems  Constitutional: No fever, chills. No unexplained wt changes. No unusual sweats HEENT: No dental problems, ear discharge, facial swelling, voice changes. No eye discharge, redness or intolerance to light Respiratory: No wheezing or difficulty breathing. No cough , mucus production Cardiovascular: No CP, leg swelling or palpitations GI: no nausea, vomiting, diarrhea or abdominal pain.  No blood in the stools. No dysphagia   Endocrine: No polyphagia, polyuria or polydipsia GU: No dysuria, gross hematuria, difficulty urinating. No urinary urgency or frequency. Musculoskeletal: No joint swellings or unusual aches or pains Skin: No change in the color of the skin, palor or rash Allergic, immunologic: No environmental allergies or food allergies Neurological: No dizziness or syncope. No headaches. No diplopia, slurred speech, motor deficits, facial numbness Hematological: No enlarged lymph nodes, easy bruising or bleeding Psychiatry: No suicidal ideas, hallucinations, behavior problems or confusion. No unusual/severe anxiety or depression.    Past Medical History  Diagnosis Date  . Hyperlipidemia   . Hypertension   . Renal impairment     has 1 functioning kidney  . Hx of ulcerative colitis 2003    silent x a while  . Gout     Past Surgical History  Procedure Laterality Date  . Umbilical hernia repair  12/10    History   Social History  . Marital Status: Married    Spouse Name: N/A  . Number of Children: 2  . Years of Education: N/A   Occupational History  . works at [employer]  . Smoking status: Never Smoker   . Smokeless tobacco: Never Used  . Alcohol Use: 12.6 oz/week    21 Glasses of wine per week  .  Drug Use: No  . Sexual Activity: Not on file   Other Topics Concern  . Not on file   Social History Narrative   2 daughters, adults    Fluid in Spanish     Family History  Problem Relation Age of Onset  . Cancer Mother     bladder   . Hyperlipidemia Mother   . Stomach cancer Mother     lymphoma  . COPD Father   . Heart disease Father     smoker, MI at age 6  . Colon cancer Neg Hx   . Prostate cancer Neg Hx       Medication List       This list is accurate as of: 08/15/14  5:59 PM.  Always use your most recent med list.               atorvastatin 20 MG tablet  Commonly known as:  LIPITOR  Take 1 tablet (20 mg total) by mouth at bedtime.     CHERRY PO  Take 4 tablets by mouth daily. 200 mg each tablet     fish oil-omega-3 fatty acids 1000 MG capsule  Take 1 g by mouth daily.     predniSONE 10 MG tablet  Commonly known as:  DELTASONE  - If you have have a gout episode:  - 3 tablets daily for 2 days, 2 tablets a day for the next 2 days, 1 tablet daily for the next 2 days.  telmisartan 80 MG tablet  Commonly known as:  MICARDIS  Take 1 tablet (80 mg total) by mouth daily.           Objective:   Physical Exam BP 116/64 mmHg  Pulse 67  Temp(Src) 98 F (36.7 C) (Oral)  Ht 6\' 1"  (1.854 m)  Wt 181 lb (82.101 kg)  BMI 23.89 kg/m2  SpO2 97%  General:   Well developed, well nourished . NAD.  Neck:  Full range of motion. Supple. No  thyromegaly , normal carotid pulse HEENT:  Normocephalic . Face symmetric, atraumatic Lungs:  CTA B Normal respiratory effort, no intercostal retractions, no accessory muscle use. Heart: RRR,  no murmur.  No pretibial edema bilaterally  Abdomen:  Not distended, soft, non-tender. No rebound or rigidity. No mass,organomegaly Skin: Exposed areas without rash. Not pale. Not jaundice Rectal:  External abnormalities: none. Normal sphincter tone. No rectal masses or tenderness.  Stool brown  Prostate: Prostate gland  firm and smooth, no enlargement, nodularity, tenderness, mass, asymmetry or induration.  Neurologic:  alert & oriented X3.  Speech normal, gait appropriate for age and unassisted Strength symmetric and appropriate for age.  Psych: Cognition and judgment appear intact.  Cooperative with normal attention span and concentration.  Behavior appropriate. No anxious or depressed appearing.       Assessment & Plan:   CMP, FLP, CBC, TSH, PSA

## 2014-08-15 NOTE — Assessment & Plan Note (Signed)
Based on last cholesterol panel, he restarted Lipitor, good compliance without apparent side effects

## 2014-12-14 ENCOUNTER — Other Ambulatory Visit: Payer: Self-pay | Admitting: Internal Medicine

## 2014-12-14 NOTE — Telephone Encounter (Signed)
Rx sent 

## 2014-12-14 NOTE — Telephone Encounter (Signed)
Pt is requesting refill on Prednisone. Pt uses PRN for gout episodes.   Last OV: 08/15/2014 Last Fill: 02/23/2014 #12 1RF  Okay to refill?

## 2014-12-14 NOTE — Telephone Encounter (Signed)
Okay #12 one refill, same sig. Please ask patient to call if his gout is not improving

## 2015-02-15 ENCOUNTER — Ambulatory Visit: Payer: Managed Care, Other (non HMO) | Admitting: Internal Medicine

## 2015-02-19 ENCOUNTER — Other Ambulatory Visit: Payer: Self-pay | Admitting: Internal Medicine

## 2015-03-22 ENCOUNTER — Encounter: Payer: Self-pay | Admitting: Internal Medicine

## 2015-03-22 ENCOUNTER — Ambulatory Visit (INDEPENDENT_AMBULATORY_CARE_PROVIDER_SITE_OTHER): Payer: Managed Care, Other (non HMO) | Admitting: Internal Medicine

## 2015-03-22 VITALS — BP 124/76 | HR 60 | Temp 98.3°F | Ht 73.0 in | Wt 186.1 lb

## 2015-03-22 DIAGNOSIS — I10 Essential (primary) hypertension: Secondary | ICD-10-CM

## 2015-03-22 DIAGNOSIS — Z09 Encounter for follow-up examination after completed treatment for conditions other than malignant neoplasm: Secondary | ICD-10-CM

## 2015-03-22 DIAGNOSIS — M1030 Gout due to renal impairment, unspecified site: Secondary | ICD-10-CM

## 2015-03-22 DIAGNOSIS — N183 Chronic kidney disease, stage 3 unspecified: Secondary | ICD-10-CM

## 2015-03-22 DIAGNOSIS — M25561 Pain in right knee: Secondary | ICD-10-CM | POA: Diagnosis not present

## 2015-03-22 DIAGNOSIS — M25562 Pain in left knee: Secondary | ICD-10-CM

## 2015-03-22 DIAGNOSIS — Z23 Encounter for immunization: Secondary | ICD-10-CM | POA: Diagnosis not present

## 2015-03-22 LAB — BASIC METABOLIC PANEL
BUN: 28 mg/dL — AB (ref 7–25)
CALCIUM: 9.2 mg/dL (ref 8.6–10.3)
CO2: 30 mmol/L (ref 20–31)
CREATININE: 2.26 mg/dL — AB (ref 0.70–1.33)
Chloride: 102 mmol/L (ref 98–110)
Glucose, Bld: 88 mg/dL (ref 65–99)
Potassium: 4.5 mmol/L (ref 3.5–5.3)
Sodium: 140 mmol/L (ref 135–146)

## 2015-03-22 LAB — AST: AST: 23 U/L (ref 10–35)

## 2015-03-22 LAB — ALT: ALT: 29 U/L (ref 9–46)

## 2015-03-22 MED ORDER — TELMISARTAN 80 MG PO TABS: 80.0000 mg | ORAL_TABLET | Freq: Every day | ORAL | Status: DC

## 2015-03-22 MED ORDER — ATORVASTATIN CALCIUM 20 MG PO TABS: 20.0000 mg | ORAL_TABLET | Freq: Every day | ORAL | Status: DC

## 2015-03-22 NOTE — Progress Notes (Signed)
Pre visit review using our clinic review tool, if applicable. No additional management support is needed unless otherwise documented below in the visit note. 

## 2015-03-22 NOTE — Patient Instructions (Signed)
Get your blood work before you leave   Stop by the first floor and get the XR     Next visit  for a  complete physical exam by May 2017, fasting   Please schedule an appointment at the front desk

## 2015-03-22 NOTE — Progress Notes (Addendum)
Subjective:    Patient ID: Devon Taylor, male    DOB: Jul 12, 1960, 54 y.o.   MRN: [id number]  DOS:  03/22/2015 Type of visit - description : ROV Interval history:  HTN: Good compliance of medication, ambulatory BPs very good Gout: No recent episodes. On prednisone as needed Knee pain: long history of knee pain, left worse than right, no swelling, redness. Worse when he walks down a hill or when he takes long walks playing golf. Pain is mostly at the medial aspect of the knees and the front.   Review of Systems Denies chest pain, difficulty breathing. No nausea, vomiting, diarrhea.   Past Medical History  Diagnosis Date  . Hyperlipidemia   . Hypertension   . Renal impairment     has 1 functioning kidney  . Hx of ulcerative colitis 2003    silent x a while  . Gout   . Shoulder pain, right     [facility], [place]    Past Surgical History  Procedure Laterality Date  . Umbilical hernia repair  12/10    Social History   Social History  . Marital Status: Married    Spouse Name: N/A  . Number of Children: 2  . Years of Education: N/A   Occupational History  . works at [employer]  . Smoking status: Never Smoker   . Smokeless tobacco: Never Used  . Alcohol Use: 12.6 oz/week    21 Glasses of wine per week  . Drug Use: No  . Sexual Activity: Not on file   Other Topics Concern  . Not on file   Social History Narrative   2 daughters, adults    Fluid in Spanish        Medication List       This list is accurate as of: 03/22/15 11:59 PM.  Always use your most recent med list.               ANTI-INFLAMMATORY ENZYME Caps  Take 1 capsule by mouth daily. [employer] Insta-flex     atorvastatin 20 MG tablet  Commonly known as:  LIPITOR  Take 1 tablet (20 mg total) by mouth at bedtime.     fish oil-omega-3 fatty acids 1000 MG capsule  Take 1 g by mouth daily.     predniSONE 10 MG tablet  Commonly known as:   DELTASONE  TAKE 3 TABLETS DAILY FOR 2 DAYS,2 TABLETS FOR 2 DAYS,1 TABLET DAILY FOR 2 DAYS     telmisartan 80 MG tablet  Commonly known as:  MICARDIS  Take 1 tablet (80 mg total) by mouth daily.           Objective:   Physical Exam BP 124/76 mmHg  Pulse 60  Temp(Src) 98.3 F (36.8 C) (Oral)  Ht 6\' 1"  (1.854 m)  Wt 186 lb 2 oz (84.426 kg)  BMI 24.56 kg/m2  SpO2 98% General:   Well developed, well nourished . NAD.  HEENT:  Normocephalic . Face symmetric, atraumatic Lungs:  CTA B Normal respiratory effort, no intercostal retractions, no accessory muscle use  MSK: Knees without effusion, range of motion normal, no TTP, no warm. Heart: RRR,  no murmur.  No pretibial edema bilaterally  Skin: Not pale. Not jaundice Neurologic:  alert & oriented X3.  Speech normal, gait appropriate for age and unassisted Psych--  Cognition and judgment appear intact.  Cooperative with normal attention span and concentration.  Behavior  appropriate. No anxious or depressed appearing.      Assessment & Plan:   Assessment>> HTN CKD, 1 functioning kidney, creatinine varies ~ 2.0 Hyperlipidemia Gout-- pred prn Ulcerative colitis DX 2003, asymptomatic  Plan: HTN: Well-controlled CKD: Check a BMP, good compliance with NSAIDs avoidance Knee pain: new issue. DJD ?, tendinitis? Will get x-rays, refer to sports medicine. Also recommend knee sleeve, ice and Tylenol. Gout: Last uric acid elevated, no recent episodes. He elected no allopurinol Primary care: Declined a flu shot, Tdap today RTC 6 months

## 2015-03-24 DIAGNOSIS — Z09 Encounter for follow-up examination after completed treatment for conditions other than malignant neoplasm: Secondary | ICD-10-CM | POA: Insufficient documentation

## 2015-03-24 NOTE — Assessment & Plan Note (Signed)
HTN: Well-controlled CKD: Check a BMP, good compliance with NSAIDs avoidance Knee pain: new issue. DJD ?, tendinitis? Will get x-rays, refer to sports medicine. Also recommend knee sleeve, ice and Tylenol. Gout: Last uric acid elevated, no recent episodes. He elected no allopurinol Primary care: Declined a flu shot, Tdap today RTC 6 months

## 2015-03-28 ENCOUNTER — Ambulatory Visit: Payer: Managed Care, Other (non HMO) | Admitting: Family Medicine

## 2015-03-29 ENCOUNTER — Encounter: Payer: Self-pay | Admitting: Internal Medicine

## 2015-03-29 DIAGNOSIS — I10 Essential (primary) hypertension: Secondary | ICD-10-CM

## 2015-04-02 ENCOUNTER — Ambulatory Visit: Payer: Managed Care, Other (non HMO) | Admitting: Family Medicine

## 2015-04-02 NOTE — Telephone Encounter (Signed)
BMP ordered

## 2015-04-03 ENCOUNTER — Other Ambulatory Visit: Payer: Managed Care, Other (non HMO)

## 2015-04-10 ENCOUNTER — Other Ambulatory Visit (INDEPENDENT_AMBULATORY_CARE_PROVIDER_SITE_OTHER): Payer: Managed Care, Other (non HMO)

## 2015-04-10 DIAGNOSIS — I10 Essential (primary) hypertension: Secondary | ICD-10-CM

## 2015-04-10 LAB — BASIC METABOLIC PANEL
BUN: 31 mg/dL — AB (ref 6–23)
CALCIUM: 9.6 mg/dL (ref 8.4–10.5)
CO2: 27 meq/L (ref 19–32)
CREATININE: 1.9 mg/dL — AB (ref 0.40–1.50)
Chloride: 104 mEq/L (ref 96–112)
GFR: 39.36 mL/min — AB (ref >60.00)
GLUCOSE: 135 mg/dL — AB (ref 70–99)
Potassium: 4.6 mEq/L (ref 3.5–5.1)
SODIUM: 138 meq/L (ref 135–145)

## 2015-05-09 ENCOUNTER — Telehealth: Payer: Self-pay | Admitting: Internal Medicine

## 2015-05-09 ENCOUNTER — Encounter: Payer: Self-pay | Admitting: Internal Medicine

## 2015-05-09 MED ORDER — PREDNISONE 10 MG PO TABS: ORAL_TABLET | ORAL | Status: DC

## 2015-05-09 NOTE — Telephone Encounter (Signed)
Takes as needed for gout, okay #12 and one refill

## 2015-05-09 NOTE — Telephone Encounter (Signed)
Caller name: Self  Can be reached: [phone] Pharmacy:  [id number] - [place], [place] [address] [phone] (Phone) [phone] (Fax)        Reason for call: Request refill on  predniSONE (DELTASONE) 10 MG tablet [id number]

## 2015-05-09 NOTE — Telephone Encounter (Signed)
Please advise 

## 2015-05-09 NOTE — Telephone Encounter (Signed)
Rx sent 

## 2015-08-07 ENCOUNTER — Encounter: Payer: Self-pay | Admitting: Internal Medicine

## 2015-08-07 ENCOUNTER — Other Ambulatory Visit: Payer: Self-pay | Admitting: Internal Medicine

## 2015-08-07 MED ORDER — PREDNISONE 10 MG PO TABS
ORAL_TABLET | ORAL | Status: DC
Start: 2015-08-07 — End: 2016-01-17

## 2015-08-09 ENCOUNTER — Encounter: Payer: Self-pay | Admitting: Internal Medicine

## 2015-08-16 ENCOUNTER — Encounter: Payer: Managed Care, Other (non HMO) | Admitting: Internal Medicine

## 2015-09-28 ENCOUNTER — Encounter: Payer: Self-pay | Admitting: Internal Medicine

## 2015-10-01 ENCOUNTER — Other Ambulatory Visit: Payer: Self-pay | Admitting: Internal Medicine

## 2015-10-01 MED ORDER — COLCHICINE 0.6 MG PO TABS: 0.6000 mg | ORAL_TABLET | Freq: Two times a day (BID) | ORAL | Status: DC | PRN

## 2015-10-03 ENCOUNTER — Telehealth: Payer: Self-pay | Admitting: Internal Medicine

## 2015-10-03 NOTE — Telephone Encounter (Signed)
Please call the patient (may be out of town, if no answer call Monday): Will  start allopurinol 100 mg one tablet daily #30 and 1 RF Advise him that the first week he is on allopurinol he is at risk of gout attacks so I recommend to take 1 colchicine twice when he start the new medicine. Will recheck his labs when he comes back next month for a physical.

## 2015-10-03 NOTE — Telephone Encounter (Signed)
LMOM informing Pt of recommendations below. Informed him to call or send MyChart message with pharmacy he would like for me to send to (unsure if he is out of town). Also instructed him to let me know if he has any questions.

## 2015-10-07 MED ORDER — ALLOPURINOL 100 MG PO TABS: 100.0000 mg | ORAL_TABLET | Freq: Every day | ORAL | Status: DC

## 2015-10-07 NOTE — Telephone Encounter (Signed)
See mychart messages 09/28/2015 and 10/03/2015.

## 2015-10-07 NOTE — Telephone Encounter (Signed)
Allopurinol 100 mg 1 tablet daily [facility].

## 2015-10-07 NOTE — Addendum Note (Signed)
Addended byDamita Dunnings D on: 10/07/2015 08:55 AM   Modules accepted: Orders

## 2015-10-10 MED ORDER — COLCHICINE 0.6 MG PO TABS: 0.6000 mg | ORAL_TABLET | Freq: Two times a day (BID) | ORAL | Status: DC | PRN

## 2015-10-14 ENCOUNTER — Telehealth: Payer: Self-pay | Admitting: Internal Medicine

## 2015-10-30 ENCOUNTER — Encounter: Payer: Managed Care, Other (non HMO) | Admitting: Internal Medicine

## 2015-10-30 NOTE — Telephone Encounter (Signed)
Completed.

## 2015-11-22 ENCOUNTER — Encounter: Payer: Self-pay | Admitting: Internal Medicine

## 2015-11-22 MED ORDER — ATORVASTATIN CALCIUM 20 MG PO TABS: 20.0000 mg | ORAL_TABLET | Freq: Every day | ORAL | 0 refills | Status: DC

## 2015-11-22 MED ORDER — TELMISARTAN 80 MG PO TABS: 80.0000 mg | ORAL_TABLET | Freq: Every day | ORAL | 0 refills | Status: DC

## 2015-11-22 NOTE — Telephone Encounter (Signed)
Requested Rx's sent to [facility] order.

## 2016-01-17 ENCOUNTER — Ambulatory Visit (INDEPENDENT_AMBULATORY_CARE_PROVIDER_SITE_OTHER): Payer: Managed Care, Other (non HMO) | Admitting: Internal Medicine

## 2016-01-17 ENCOUNTER — Encounter: Payer: Self-pay | Admitting: Internal Medicine

## 2016-01-17 ENCOUNTER — Ambulatory Visit (HOSPITAL_BASED_OUTPATIENT_CLINIC_OR_DEPARTMENT_OTHER)
Admission: RE | Admit: 2016-01-17 | Discharge: 2016-01-17 | Disposition: A | Discharge status: Home / Self Care | Payer: Managed Care, Other (non HMO) | Source: Ambulatory Visit | Attending: Internal Medicine | Admitting: Internal Medicine

## 2016-01-17 VITALS — BP 122/80 | HR 67 | Temp 98.0°F | Resp 16 | Ht 75.0 in | Wt 181.2 lb

## 2016-01-17 DIAGNOSIS — M79671 Pain in right foot: Secondary | ICD-10-CM | POA: Insufficient documentation

## 2016-01-17 DIAGNOSIS — M12871 Other specific arthropathies, not elsewhere classified, right ankle and foot: Secondary | ICD-10-CM | POA: Diagnosis not present

## 2016-01-17 DIAGNOSIS — M25562 Pain in left knee: Secondary | ICD-10-CM

## 2016-01-17 DIAGNOSIS — M25561 Pain in right knee: Secondary | ICD-10-CM

## 2016-01-17 DIAGNOSIS — M1000 Idiopathic gout, unspecified site: Secondary | ICD-10-CM | POA: Diagnosis not present

## 2016-01-17 DIAGNOSIS — R937 Abnormal findings on diagnostic imaging of other parts of musculoskeletal system: Secondary | ICD-10-CM | POA: Diagnosis not present

## 2016-01-17 DIAGNOSIS — Z0000 Encounter for general adult medical examination without abnormal findings: Secondary | ICD-10-CM

## 2016-01-17 LAB — CBC WITH DIFFERENTIAL/PLATELET
BASOS PCT: 0 %
Basophils Absolute: 0 cells/uL (ref 0–200)
Eosinophils Absolute: 177 cells/uL (ref 15–500)
Eosinophils Relative: 3 %
HEMATOCRIT: 38.8 % (ref 38.5–50.0)
HEMOGLOBIN: 12.8 g/dL — AB (ref 13.2–17.1)
LYMPHS ABS: 2065 {cells}/uL (ref 850–3900)
Lymphocytes Relative: 35 %
MCH: 29.8 pg (ref 27.0–33.0)
MCHC: 33 g/dL (ref 32.0–36.0)
MCV: 90.2 fL (ref 80.0–100.0)
MONO ABS: 531 {cells}/uL (ref 200–950)
MPV: 9.6 fL (ref 7.5–12.5)
Monocytes Relative: 9 %
Neutro Abs: 3127 cells/uL (ref 1500–7800)
Neutrophils Relative %: 53 %
Platelets: 276 10*3/uL (ref 140–400)
RBC: 4.3 MIL/uL (ref 4.20–5.80)
RDW: 13.7 % (ref 11.0–15.0)
WBC: 5.9 10*3/uL (ref 3.8–10.8)

## 2016-01-17 LAB — COMPREHENSIVE METABOLIC PANEL
ALBUMIN: 3.4 g/dL — AB (ref 3.6–5.1)
ALK PHOS: 60 U/L (ref 40–115)
ALT: 36 U/L (ref 9–46)
AST: 30 U/L (ref 10–35)
BILIRUBIN TOTAL: 0.3 mg/dL (ref 0.2–1.2)
BUN: 30 mg/dL — ABNORMAL HIGH (ref 7–25)
CALCIUM: 8.8 mg/dL (ref 8.6–10.3)
CO2: 26 mmol/L (ref 20–31)
Chloride: 98 mmol/L (ref 98–110)
Creat: 1.88 mg/dL — ABNORMAL HIGH (ref 0.70–1.33)
GLUCOSE: 98 mg/dL (ref 65–99)
Potassium: 4.4 mmol/L (ref 3.5–5.3)
Sodium: 132 mmol/L — ABNORMAL LOW (ref 135–146)
Total Protein: 5.7 g/dL — ABNORMAL LOW (ref 6.1–8.1)

## 2016-01-17 LAB — LIPID PANEL
Cholesterol: 146 mg/dL (ref 125–200)
HDL: 39 mg/dL — ABNORMAL LOW (ref >=40)
LDL Cholesterol: 54 mg/dL (ref <130)
Total CHOL/HDL Ratio: 3.7 Ratio (ref <=5.0)
Triglycerides: 266 mg/dL — ABNORMAL HIGH (ref <150)
VLDL: 53 mg/dL — AB (ref <30)

## 2016-01-17 LAB — URIC ACID: URIC ACID, SERUM: 8.7 mg/dL — AB (ref 4.0–8.0)

## 2016-01-17 NOTE — Assessment & Plan Note (Addendum)
Td 2005; pnm 23-- 01-2014; prevnar-- 08-2014 Declined flu shot Colon cancer screening-- cscope 09-2011, 1 polyp, repeat in 5 years Prostate cancer screening-- DRE - PSA wnl 2016 Labs: CMP, CBC, FLP, HIV, uric acid  (no fasting) Doing well with diet and exercise

## 2016-01-17 NOTE — Progress Notes (Signed)
Subjective:    Patient ID: Devon Taylor, male    DOB: [birth year], 55 y.o.   MRN: [id number]  DOS:  01/17/2016 Type of visit - description : cpx Interval history:  Gout: Having episodes of redness, swelling and joint tenderness that he treats with colchicine as needed. Episodes happen every 3-4 months. He decided not to take allopurinol. He is however more concerned about generalized aches and pains that happened after plays golf. Mostly located at the ankles, feet, knees and specifically the right great toe. Those aches and pains and not associated with swelling. He denies any major problems with stiffness, no hand puffiness.  Review of Systems Constitutional: No fever. No chills. No unexplained wt changes. No unusual sweats  HEENT: No dental problems, no ear discharge, no facial swelling, no voice changes. No eye discharge, no eye  redness , no  intolerance to light   Respiratory: No wheezing , no  difficulty breathing. No cough , no mucus production  Cardiovascular: No CP, no leg swelling , no  Palpitations  GI: no nausea, no vomiting, no diarrhea , no  abdominal pain.  No blood in the stools. No dysphagia, no odynophagia    Endocrine: No polyphagia, no polyuria , no polydipsia  GU: No dysuria, gross hematuria, difficulty urinating. No urinary urgency, no frequency.  Musculoskeletal: see above  Skin: No change in the color of the skin, palor , no  Rash  Allergic, immunologic: No environmental allergies , no  food allergies  Neurological: No dizziness no  syncope. No headaches. No diplopia, no slurred, no slurred speech, no motor deficits, no facial  Numbness  Hematological: No enlarged lymph nodes, no easy bruising , no unusual bleedings  Psychiatry: No suicidal ideas, no hallucinations, no beavior problems, no confusion.  No unusual/severe anxiety, no depression   Past Medical History:  Diagnosis Date  . Gout   . Hx of ulcerative colitis 2003   silent x a  while  . Hyperlipidemia   . Hypertension   . Renal impairment    has 1 functioning kidney  . Shoulder pain, right    [facility], [place]    Past Surgical History:  Procedure Laterality Date  . UMBILICAL HERNIA REPAIR  12/10    Social History   Social History  . Marital status: Married    Spouse name: N/A  . Number of children: 2  . Years of education: N/A   Occupational History  . works at [employer] Topics  . Smoking status: Never Smoker  . Smokeless tobacco: Never Used  . Alcohol use 12.6 oz/week    21 Glasses of wine per week  . Drug use: No  . Sexual activity: Not on file   Other Topics Concern  . Not on file   Social History Narrative   2 daughters, adults    Fluid in Spanish     Family History  Problem Relation Age of Onset  . Cancer Mother     bladder   . Hyperlipidemia Mother   . Stomach cancer Mother     lymphoma  . COPD Father   . Heart disease Father     smoker, MI at age 52  . Colon cancer Neg Hx   . Prostate cancer Neg Hx        Medication List       Accurate as of 01/17/16  3:35 PM. Always use your most recent med list.  allopurinol 100 MG tablet Commonly known as:  ZYLOPRIM Take 1 tablet (100 mg total) by mouth daily.   ANTI-INFLAMMATORY ENZYME Caps Take 1 capsule by mouth daily. [employer] Insta-flex   atorvastatin 20 MG tablet Commonly known as:  LIPITOR Take 1 tablet (20 mg total) by mouth at bedtime.   colchicine 0.6 MG tablet Take 1 tablet (0.6 mg total) by mouth 2 (two) times daily as needed.   fish oil-omega-3 fatty acids 1000 MG capsule Take 1 g by mouth daily.   predniSONE 10 MG tablet Commonly known as:  DELTASONE TAKE 3 TABLETS DAILY FOR 2 DAYS,2 TABLETS FOR 2 DAYS,1 TABLET DAILY FOR 2 DAYS   telmisartan 80 MG tablet Commonly known as:  MICARDIS Take 1 tablet (80 mg total) by mouth daily.          Objective:   Physical Exam BP 122/80 (BP  Location: Left Arm, Patient Position: Sitting, Cuff Size: Normal)   Pulse 67   Temp 98 F (36.7 C) (Oral)   Resp 16   Ht 6\' 3"  (1.905 m)   Wt 181 lb 3.2 oz (82.2 kg)   SpO2 97%   BMI 22.65 kg/m   General:   Well developed, well nourished . NAD.  Neck: No  thyromegaly  HEENT:  Normocephalic . Face symmetric, atraumatic Lungs:  CTA B Normal respiratory effort, no intercostal retractions, no accessory muscle use. Heart: RRR,  no murmur.  No pretibial edema bilaterally  Abdomen:  Not distended, soft, non-tender. No rebound or rigidity.   Skin: Exposed areas without rash. Not pale. Not jaundice MSK: Wrist and fingers without synovitis Feet: wnl  inspection on palpation except for mild bunion on the right foot. Neurologic:  alert & oriented X3.  Speech normal, gait appropriate for age and unassisted Strength symmetric and appropriate for age.  Psych: Cognition and judgment appear intact.  Cooperative with normal attention span and concentration.  Behavior appropriate. No anxious or depressed appearing.    Assessment & Plan:  Assessment>> HTN CKD, 1 functioning kidney, creatinine varies ~ 2.0 Hyperlipidemia Gout-- pred prn Ulcerative colitis DX 2003, asymptomatic  PLAN: HTN: Continue with Micardis. Checking labs CKD: Checking labs Hyperlipidemia: Continue Lipitor checking labs Gout: Has episodes every 3-4 months, very reluctant to take allopurinol, currently using colchicine. Benefits of allopurinol discussed (prevention) and problems related to on and off use of prednisone also discussed. Arthralgias: Having knee , ankle and feet pain after playing golf or been active likely from DJD, he needs to clearly distinguish those sx from gout as they won't respond to colchicine. Reports sx are quite bothersome. Recommend Tylenol, stretching and stay active. seems to be particularly bothered by the pain at the right great toe, likely related to bunion. Refer to orthopedic.check a  XR RTC 6 months.   In addition to his physical exam,I spent more than  15 min with the patient: >50% of the time counseling regards aches and pains, how to distinguish between arthralgias and gout, managing his chronic medical problems and listening to all his concerns related to pain. Multiple questions answered.

## 2016-01-17 NOTE — Progress Notes (Signed)
Pre visit review using our clinic review tool, if applicable. No additional management support is needed unless otherwise documented below in the visit note. 

## 2016-01-17 NOTE — Patient Instructions (Addendum)
  GO TO THE FRONT DESK Schedule labs to be done next week, fasting: CMP, FLP, CBC, HIV, uric acid  Schedule your next appointment for a  routine checkup in 6 months

## 2016-01-18 LAB — HIV ANTIBODY (ROUTINE TESTING W REFLEX): HIV 1&2 Ab, 4th Generation: NONREACTIVE

## 2016-01-19 NOTE — Assessment & Plan Note (Signed)
HTN: Continue with Micardis. Checking labs CKD: Checking labs Hyperlipidemia: Continue Lipitor checking labs Gout: Has episodes every 3-4 months, very reluctant to take allopurinol, currently using colchicine. Benefits of allopurinol discussed (prevention) and problems related to on and off use of prednisone also discussed. Arthralgias: Having knee , ankle and feet pain after playing golf or been active likely from DJD, he needs to clearly distinguish those sx from gout as they won't respond to colchicine. Reports sx are quite bothersome. Recommend Tylenol, stretching and stay active. seems to be particularly bothered by the pain at the right great toe, likely related to bunion. Refer to orthopedic.check a XR RTC 6 months.

## 2016-01-20 ENCOUNTER — Telehealth: Payer: Self-pay

## 2016-01-20 MED ORDER — COLCHICINE 0.6 MG PO TABS: 0.6000 mg | ORAL_TABLET | Freq: Two times a day (BID) | ORAL | 2 refills | Status: DC | PRN

## 2016-01-20 NOTE — Telephone Encounter (Signed)
-----   Message from Colon Branch, MD sent at 01/20/2016 12:51 PM EDT ----- Release these results to MyChart with attached comments  Your cholesterol is very good at 146, triglycerides are slightly elevated, watch ypur diet. The kidney function is a stable with a creatinine of 1.8. Uric acid is elevated which is consistent with gout.  The hemoglobin and sodium are slightly out of range, will recheck when you come back. Continue with the same medications.

## 2016-01-20 NOTE — Telephone Encounter (Signed)
Discussed with the patient and he verbalized understanding, he said you prescribed Allopurinol and colchicine 6 months ago and wanted to know if you would like for him to take it. He said he never took either one of these medications. Please advise   KP

## 2016-01-20 NOTE — Telephone Encounter (Signed)
I spoke with the patient today, as previously recommended allopurinol is recommended,  he was and still remains reluctant to take it consequently will take colchicine as needed.

## 2016-01-22 ENCOUNTER — Encounter: Payer: Self-pay | Admitting: Internal Medicine

## 2016-02-04 ENCOUNTER — Encounter: Payer: Self-pay | Admitting: Internal Medicine

## 2016-03-16 ENCOUNTER — Encounter: Payer: Self-pay | Admitting: Internal Medicine

## 2016-03-16 MED ORDER — TELMISARTAN 80 MG PO TABS: 80.0000 mg | ORAL_TABLET | Freq: Every day | ORAL | 1 refills | Status: DC

## 2016-03-16 MED ORDER — ATORVASTATIN CALCIUM 20 MG PO TABS: 20.0000 mg | ORAL_TABLET | Freq: Every day | ORAL | 1 refills | Status: DC

## 2016-07-19 ENCOUNTER — Other Ambulatory Visit: Payer: Self-pay | Admitting: Internal Medicine

## 2016-07-24 ENCOUNTER — Encounter: Payer: Self-pay | Admitting: Internal Medicine

## 2016-07-24 ENCOUNTER — Ambulatory Visit (INDEPENDENT_AMBULATORY_CARE_PROVIDER_SITE_OTHER): Payer: Managed Care, Other (non HMO) | Admitting: Internal Medicine

## 2016-07-24 ENCOUNTER — Ambulatory Visit (HOSPITAL_BASED_OUTPATIENT_CLINIC_OR_DEPARTMENT_OTHER)
Admission: RE | Admit: 2016-07-24 | Discharge: 2016-07-24 | Disposition: A | Discharge status: Home / Self Care | Payer: Managed Care, Other (non HMO) | Source: Ambulatory Visit | Attending: Internal Medicine | Admitting: Internal Medicine

## 2016-07-24 VITALS — BP 116/74 | HR 64 | Temp 97.8°F | Resp 14 | Ht 75.0 in | Wt 171.5 lb

## 2016-07-24 DIAGNOSIS — R634 Abnormal weight loss: Secondary | ICD-10-CM | POA: Diagnosis present

## 2016-07-24 DIAGNOSIS — Z1159 Encounter for screening for other viral diseases: Secondary | ICD-10-CM

## 2016-07-24 LAB — COMPREHENSIVE METABOLIC PANEL
ALK PHOS: 58 U/L (ref 39–117)
ALT: 37 U/L (ref 0–53)
AST: 31 U/L (ref 0–37)
Albumin: 3.9 g/dL (ref 3.5–5.2)
BUN: 28 mg/dL — AB (ref 6–23)
CHLORIDE: 104 meq/L (ref 96–112)
CO2: 28 mEq/L (ref 19–32)
Calcium: 9.6 mg/dL (ref 8.4–10.5)
Creatinine, Ser: 1.96 mg/dL — ABNORMAL HIGH (ref 0.40–1.50)
GFR: 37.8 mL/min — AB (ref >60.00)
GLUCOSE: 94 mg/dL (ref 70–99)
POTASSIUM: 4.6 meq/L (ref 3.5–5.1)
SODIUM: 137 meq/L (ref 135–145)
TOTAL PROTEIN: 6.5 g/dL (ref 6.0–8.3)
Total Bilirubin: 0.5 mg/dL (ref 0.2–1.2)

## 2016-07-24 LAB — CBC WITH DIFFERENTIAL/PLATELET
BASOS PCT: 0.7 % (ref 0.0–3.0)
Basophils Absolute: 0 10*3/uL (ref 0.0–0.1)
EOS PCT: 2.1 % (ref 0.0–5.0)
Eosinophils Absolute: 0.1 10*3/uL (ref 0.0–0.7)
HCT: 40.8 % (ref 39.0–52.0)
HEMOGLOBIN: 13.5 g/dL (ref 13.0–17.0)
LYMPHS ABS: 1.2 10*3/uL (ref 0.7–4.0)
Lymphocytes Relative: 24.1 % (ref 12.0–46.0)
MCHC: 33.1 g/dL (ref 30.0–36.0)
MCV: 88 fl (ref 78.0–100.0)
MONO ABS: 0.4 10*3/uL (ref 0.1–1.0)
MONOS PCT: 8.1 % (ref 3.0–12.0)
Neutro Abs: 3.2 10*3/uL (ref 1.4–7.7)
Neutrophils Relative %: 65 % (ref 43.0–77.0)
Platelets: 261 10*3/uL (ref 150.0–400.0)
RBC: 4.63 Mil/uL (ref 4.22–5.81)
RDW: 14.1 % (ref 11.5–15.5)
WBC: 4.9 10*3/uL (ref 4.0–10.5)

## 2016-07-24 LAB — URINALYSIS, ROUTINE W REFLEX MICROSCOPIC
BILIRUBIN URINE: NEGATIVE
HGB URINE DIPSTICK: NEGATIVE
KETONES UR: NEGATIVE
LEUKOCYTES UA: NEGATIVE
Nitrite: NEGATIVE
RBC / HPF: NONE SEEN (ref 0–?)
Specific Gravity, Urine: <=1.005 — AB (ref 1.000–1.030)
Total Protein, Urine: 30 — AB
UROBILINOGEN UA: 0.2 (ref 0.0–1.0)
Urine Glucose: NEGATIVE
pH: 6 (ref 5.0–8.0)

## 2016-07-24 LAB — TSH: TSH: 2.28 u[IU]/mL (ref 0.35–4.50)

## 2016-07-24 LAB — HEPATITIS C ANTIBODY: HCV Ab: NEGATIVE

## 2016-07-24 NOTE — Patient Instructions (Signed)
GO TO THE LAB : Get the blood work     GO TO THE FRONT DESK Schedule your next appointment for a  Check up in 3 months      STOP BY THE FIRST FLOOR:  get the XR

## 2016-07-24 NOTE — Progress Notes (Signed)
Pre visit review using our clinic review tool, if applicable. No additional management support is needed unless otherwise documented below in the visit note. 

## 2016-07-24 NOTE — Progress Notes (Signed)
Subjective:    Patient ID: Devon Taylor, male    DOB: 1960-12-28, 56 y.o.   MRN: [id number]  DOS:  07/24/2016 Type of visit - description : Routine checkup Interval history: Main concern is unintended weight loss. The trend started about one year ago.   He continued to exercise regularly 3 times a week and otherwise  feels well. His diet is about the same, did cutdown wine intake to only the weekends (he was never a heavy drinker); he  simply likes to be a little healthier. When asked, admits to a lot of stress, work related, unable to "turn off", "lots of pressure at work ". He carries two cell phones. Family life is fortunately good.  Wt Readings from Last 3 Encounters:  07/24/16 171 lb 8 oz (77.8 kg)  01/17/16 181 lb 3.2 oz (82.2 kg)  03/22/15 186 lb 2 oz (84.4 kg)    Review of Systems  He denies fever chills, occasionally has some night sweats. For years has sometimes dizziness associated with nausea every 2-3 months. Symptoms are not worse or more noticeable. BMs normal, normal stools color, no blood in the stools, no abdominal pain, postprandial bloating or postprandial pain. No dysphagia or odynophagia. No depression per se. Admits to some difficulty sleeping, not a new issue. No dysuria, gross hematuria. No headaches or unusual aches and pains.   Past Medical History:  Diagnosis Date  . Gout   . Hx of ulcerative colitis 2003   silent x a while  . Hyperlipidemia   . Hypertension   . Renal impairment    has 1 functioning kidney  . Shoulder pain, right    [facility], [place]    Past Surgical History:  Procedure Laterality Date  . UMBILICAL HERNIA REPAIR  12/10    Social History   Social History  . Marital status: Married    Spouse name: N/A  . Number of children: 2  . Years of education: N/A   Occupational History  . works at [employer] Topics  . Smoking status: Never Smoker  . Smokeless  tobacco: Never Used  . Alcohol use 8.4 oz/week    14 Glasses of wine per week     Comment: 2 glasses at night   . Drug use: No  . Sexual activity: Not on file   Other Topics Concern  . Not on file   Social History Narrative   2 daughters, adults    Fluid in Spanish      Allergies as of 07/24/2016      Reactions   Ace Inhibitors    REACTION: cough with enalapril   Nsaids    Renal Insufficiency---he has been advised to avoid all NSAIDs   Simvastatin Other (See Comments)   Muscle aches      Medication List       Accurate as of 07/24/16 11:59 PM. Always use your most recent med list.          ANTI-INFLAMMATORY ENZYME Caps Take 1 capsule by mouth daily. GNC Insta-flex   atorvastatin 20 MG tablet Commonly known as:  LIPITOR Take 1 tablet (20 mg total) by mouth at bedtime.   colchicine 0.6 MG tablet Take 1 tablet (0.6 mg total) by mouth 2 (two) times daily as needed.   fish oil-omega-3 fatty acids 1000 MG capsule Take 1 g by mouth daily.   multivitamin tablet Take 1 tablet by mouth daily.  telmisartan 80 MG tablet Commonly known as:  MICARDIS Take 1 tablet (80 mg total) by mouth daily.          Objective:   Physical Exam BP 116/74 (BP Location: Left Arm, Patient Position: Sitting, Cuff Size: Normal)   Pulse 64   Temp 97.8 F (36.6 C) (Oral)   Resp 14   Ht 6\' 3"  (1.905 m)   Wt 171 lb 8 oz (77.8 kg)   SpO2 98%   BMI 21.44 kg/m   General:   Well developed, well nourished . NAD.  Neck: No  thyromegaly  HEENT:  Normocephalic . Face symmetric, atraumatic Lungs:  CTA B Normal respiratory effort, no intercostal retractions, no accessory muscle use. Heart: RRR,  no murmur.  No pretibial edema bilaterally  Abdomen:  Not distended, soft, non-tender. No rebound or rigidity.   Skin: Exposed areas without rash. Not pale. Not jaundice Neurologic:  alert & oriented X3.  Speech normal, gait appropriate for age and unassisted Strength symmetric and  appropriate for age.  Psych: Cognition and judgment appear intact.  Cooperative with normal attention span and concentration.  Behavior appropriate. Slightly anxious but not depressed appearing.    Assessment & Plan:    Assessment>> HTN CKD, one functioning kidney, creatinine varies ~ 2.0 Hyperlipidemia Gout-- pred prn Ulcerative colitis DX 2003, asymptomatic  PLAN: Weight loss: unexplained weight loss, physical exam and ROS are unremarkable except for stress and feeling anxious. During the conversation he did mention that the stress is really bothering him. Plan: CMP, CBC, TSH, UA, chest x-ray to investigate causes of weight loss. If w/u (-)  will reassess in 3 months Anxiety: I think this is playing a role on weight loss, counseling provided. Encouraged to think about yoga, meditation, see a counselor? (contacts provided), medication is also an option. He will let me know.  Hep C screening RTC 3 months

## 2016-07-26 NOTE — Assessment & Plan Note (Signed)
Weight loss: unexplained weight loss, physical exam and ROS are unremarkable except for stress and feeling anxious. During the conversation he did mention that the stress is really bothering him. Plan: CMP, CBC, TSH, UA, chest x-ray to investigate causes of weight loss. If w/u (-)  will reassess in 3 months Anxiety: I think this is playing a role on weight loss, counseling provided. Encouraged to think about yoga, meditation, see a counselor? (contacts provided), medication is also an option. He will let me know.  Hep C screening RTC 3 months

## 2016-07-28 ENCOUNTER — Ambulatory Visit: Payer: Managed Care, Other (non HMO) | Admitting: Internal Medicine

## 2016-07-30 ENCOUNTER — Encounter: Payer: Self-pay | Admitting: Internal Medicine

## 2016-07-30 MED ORDER — ATORVASTATIN CALCIUM 20 MG PO TABS: 20.0000 mg | ORAL_TABLET | Freq: Every day | ORAL | 1 refills | Status: DC

## 2016-08-02 ENCOUNTER — Encounter: Payer: Self-pay | Admitting: Internal Medicine

## 2016-08-03 MED ORDER — TELMISARTAN 80 MG PO TABS: 80.0000 mg | ORAL_TABLET | Freq: Every day | ORAL | 1 refills | Status: DC

## 2016-09-04 ENCOUNTER — Encounter: Payer: Self-pay | Admitting: Internal Medicine

## 2016-10-20 ENCOUNTER — Encounter: Payer: Self-pay | Admitting: Internal Medicine

## 2016-10-23 ENCOUNTER — Ambulatory Visit (INDEPENDENT_AMBULATORY_CARE_PROVIDER_SITE_OTHER): Payer: Managed Care, Other (non HMO) | Admitting: Internal Medicine

## 2016-10-23 ENCOUNTER — Encounter: Payer: Self-pay | Admitting: Internal Medicine

## 2016-10-23 VITALS — BP 100/73 | HR 71 | Temp 97.9°F | Resp 12 | Ht 75.0 in | Wt 171.0 lb

## 2016-10-23 DIAGNOSIS — M170 Bilateral primary osteoarthritis of knee: Secondary | ICD-10-CM | POA: Diagnosis not present

## 2016-10-23 DIAGNOSIS — R634 Abnormal weight loss: Secondary | ICD-10-CM

## 2016-10-23 DIAGNOSIS — I10 Essential (primary) hypertension: Secondary | ICD-10-CM | POA: Diagnosis not present

## 2016-10-23 MED ORDER — TELMISARTAN 80 MG PO TABS: 80.0000 mg | ORAL_TABLET | Freq: Every day | ORAL | 1 refills | Status: DC

## 2016-10-23 MED ORDER — ATORVASTATIN CALCIUM 20 MG PO TABS: 20.0000 mg | ORAL_TABLET | Freq: Every day | ORAL | 1 refills | Status: DC

## 2016-10-23 MED ORDER — MITIGARE 0.6 MG PO CAPS: 0.6000 mg | ORAL_CAPSULE | Freq: Two times a day (BID) | ORAL | 0 refills | Status: AC | PRN

## 2016-10-23 NOTE — Progress Notes (Signed)
Subjective:    Patient ID: Devon Taylor, male    DOB: 1960/08/21, 56 y.o.   MRN: [id number]  DOS:  10/23/2016 Type of visit - description : Follow-up  Interval history: Anxiety: Doing better HTN: BP today was low, he admits that sometimes he feels slightly dizzy particularly when he stands up. Symptoms are not severe. No nausea or vomiting. DJD: More than 1 year history of bilateral knee pain, worse on the left. Pain is  getting worse over the last few months. It is steady, decreased flexibility. Denies knee redness, swelling or warmness.   Wt Readings from Last 3 Encounters:  10/23/16 171 lb (77.6 kg)  07/24/16 171 lb 8 oz (77.8 kg)  01/17/16 181 lb 3.2 oz (82.2 kg)     Review of Systems   Past Medical History:  Diagnosis Date  . Gout   . Hx of ulcerative colitis 2003   silent x a while  . Hyperlipidemia   . Hypertension   . Renal impairment    has 1 functioning kidney  . Shoulder pain, right    [facility], [place]    Past Surgical History:  Procedure Laterality Date  . UMBILICAL HERNIA REPAIR  12/10    Social History   Social History  . Marital status: Married    Spouse name: N/A  . Number of children: 2  . Years of education: N/A   Occupational History  . works at [employer] Topics  . Smoking status: Never Smoker  . Smokeless tobacco: Never Used  . Alcohol use 8.4 oz/week    14 Glasses of wine per week     Comment: 2 glasses at night   . Drug use: No  . Sexual activity: Not on file   Other Topics Concern  . Not on file   Social History Narrative   2 daughters, adults    Fluid in Spanish      Allergies as of 10/23/2016      Reactions   Ace Inhibitors    REACTION: cough with enalapril   Nsaids    Renal Insufficiency---he has been advised to avoid all NSAIDs   Simvastatin Other (See Comments)   Muscle aches      Medication List       Accurate as of 10/23/16  8:39 AM. Always  use your most recent med list.          ANTI-INFLAMMATORY ENZYME Caps Take 1 capsule by mouth daily. GNC Insta-flex   atorvastatin 20 MG tablet Commonly known as:  LIPITOR Take 1 tablet (20 mg total) by mouth at bedtime.   colchicine 0.6 MG tablet Take 1 tablet (0.6 mg total) by mouth 2 (two) times daily as needed.   fish oil-omega-3 fatty acids 1000 MG capsule Take 1 g by mouth daily.   multivitamin tablet Take 1 tablet by mouth daily.   telmisartan 80 MG tablet Commonly known as:  MICARDIS Take 1 tablet (80 mg total) by mouth daily.          Objective:   Physical Exam BP 100/73 (BP Location: Left Arm, Patient Position: Sitting, Cuff Size: Small)   Pulse 71   Temp 97.9 F (36.6 C) (Oral)   Resp 12   Ht 6\' 3"  (1.905 m)   Wt 171 lb (77.6 kg)   SpO2 100%   BMI 21.37 kg/m  General:   Well developed, well nourished . NAD.  HEENT:  Normocephalic . Face symmetric, atraumatic Lungs:  CTA B Normal respiratory effort, no intercostal retractions, no accessory muscle use. Heart: RRR,  no murmur.  No pretibial edema bilaterally  Skin: Not pale. Not jaundice MSK: Knees with bony changes consistent with DJD but no effusion, redness or swelling. Neurologic:  alert & oriented X3.  Speech normal, gait appropriate for age and unassisted Psych--  Cognition and judgment appear intact.  Cooperative with normal attention span and concentration.  Behavior appropriate. No anxious or depressed appearing.      Assessment & Plan:   Assessment>> HTN CKD, one functioning kidney, creatinine varies ~ 2.0 Hyperlipidemia Gout-- pred prn Ulcerative colitis DX 2003, asymptomatic  PLAN: Weight loss: w/u from  07-2016 (-). Weight is now stable. Anxiety: Since the last time he is better, trying to live a more balanced life, reflecting more. He did not get to see a counselor but again overall he feels better. HTN: No recent ambulatory BPs, BP today is low, will monitor BPs at home  and decreased Micardis if needed. See AVS. will contact me if help is needed managing his blood pressure. DJD: Knee pain as described above, refer to orthopedic surgery, I wonder if he may benefit from visco-supplementation   RTC 10-18 CPX

## 2016-10-23 NOTE — Patient Instructions (Addendum)
  GO TO THE FRONT DESK Schedule your next appointment for a  physical exam night October 2018    Check the  blood pressure 2 or 3 times a   Week  Be sure your blood pressure is between 110/65 and  140/85.  If your blood pressure is less than 110 consistently, decrease Micardis 80 mg to half tablet daily. Then continue checking . If your blood pressure was normal up to normal or it goes too high to me know.

## 2016-10-23 NOTE — Progress Notes (Signed)
Pre visit review using our clinic review tool, if applicable. No additional management support is needed unless otherwise documented below in the visit note. 

## 2016-10-24 NOTE — Assessment & Plan Note (Signed)
Weight loss: w/u from  07-2016 (-). Weight is now stable. Anxiety: Since the last time he is better, trying to live a more balanced life, reflecting more. He did not get to see a counselor but again overall he feels better. HTN: No recent ambulatory BPs, BP today is low, will monitor BPs at home and decreased Micardis if needed. See AVS. will contact me if help is needed managing his blood pressure. DJD: Knee pain as described above, refer to orthopedic surgery, I wonder if he may benefit from visco-supplementation   RTC 10-18 CPX

## 2016-11-01 ENCOUNTER — Encounter: Payer: Self-pay | Admitting: Internal Medicine

## 2016-11-30 ENCOUNTER — Encounter: Payer: Self-pay | Admitting: Internal Medicine

## 2016-12-22 ENCOUNTER — Ambulatory Visit (AMBULATORY_SURGERY_CENTER): Payer: Self-pay | Admitting: *Deleted

## 2016-12-22 VITALS — Ht 73.0 in | Wt 177.2 lb

## 2016-12-22 DIAGNOSIS — Z86010 Personal history of colonic polyps: Secondary | ICD-10-CM

## 2016-12-22 MED ORDER — NA SULFATE-K SULFATE-MG SULF 17.5-3.13-1.6 GM/177ML PO SOLN: 1.0000 | Freq: Once | ORAL | 0 refills | Status: AC

## 2016-12-22 NOTE — Progress Notes (Signed)
Denies allergies to eggs or soy products. Denies complications with sedation or anesthesia. Denies O2 use. Denies use of diet or weight loss medications.  Emmi instructions given for colonoscopy.  

## 2016-12-28 ENCOUNTER — Ambulatory Visit (INDEPENDENT_AMBULATORY_CARE_PROVIDER_SITE_OTHER): Payer: Managed Care, Other (non HMO) | Admitting: Orthopaedic Surgery

## 2016-12-28 ENCOUNTER — Ambulatory Visit (INDEPENDENT_AMBULATORY_CARE_PROVIDER_SITE_OTHER): Payer: Self-pay

## 2016-12-28 ENCOUNTER — Encounter (INDEPENDENT_AMBULATORY_CARE_PROVIDER_SITE_OTHER): Payer: Self-pay | Admitting: Orthopaedic Surgery

## 2016-12-28 ENCOUNTER — Ambulatory Visit (INDEPENDENT_AMBULATORY_CARE_PROVIDER_SITE_OTHER): Payer: Managed Care, Other (non HMO)

## 2016-12-28 VITALS — BP 114/64 | HR 74 | Resp 14 | Ht 73.0 in | Wt 175.0 lb

## 2016-12-28 DIAGNOSIS — M170 Bilateral primary osteoarthritis of knee: Secondary | ICD-10-CM

## 2016-12-28 DIAGNOSIS — G8929 Other chronic pain: Secondary | ICD-10-CM | POA: Diagnosis not present

## 2016-12-28 DIAGNOSIS — M25561 Pain in right knee: Secondary | ICD-10-CM

## 2016-12-28 DIAGNOSIS — M25562 Pain in left knee: Secondary | ICD-10-CM

## 2016-12-28 MED ORDER — BUPIVACAINE HCL 0.5 % IJ SOLN
3.0000 mL | INTRAMUSCULAR | Status: AC | PRN
  Administered 2016-12-28: 3 mL via INTRA_ARTICULAR

## 2016-12-28 MED ORDER — METHYLPREDNISOLONE ACETATE 40 MG/ML IJ SUSP
80.0000 mg | INTRAMUSCULAR | Status: AC | PRN
  Administered 2016-12-28: 80 mg

## 2016-12-28 MED ORDER — LIDOCAINE HCL 1 % IJ SOLN
5.0000 mL | INTRAMUSCULAR | Status: AC | PRN
  Administered 2016-12-28: 5 mL

## 2016-12-28 NOTE — Progress Notes (Signed)
Office Visit Note   Patient: Devon Taylor           Date of Birth: 17-Sep-1960           MRN: [id number] Visit Date: 12/28/2016              Requested by: Colon Branch, [address] [place], [place] [address] PCP: Colon Branch, MD   Assessment & Plan: Visit Diagnoses:  1. Chronic pain of left knee   2. Chronic pain of right knee   3. Bilateral primary osteoarthritis of knee   Osteoarthritis appears to be more prevalent about both patellae consistent with chondromalacia patella  Plan: Cortisone injection right knee. Follow-up in 2 weeks and consider cortisone injection left knee. Long discussion regarding what he may expect over time.  Instructed in quad strengthening exercises. Has 1 functioning kidney and is limited in his ability to take NSAIDs  Follow-Up Instructions: Return in about 2 weeks (around 01/11/2017).   Orders:  Orders Placed This Encounter  Procedures  . XR KNEE 3 VIEW RIGHT  . XR KNEE 3 VIEW LEFT   No orders of the defined types were placed in this encounter.     Procedures: Large Joint Inj Date/Time: 12/28/2016 10:14 AM Performed by: Garald Balding Authorized by: Garald Balding   Consent Given by:  Patient Timeout: prior to procedure the correct patient, procedure, and site was verified   Indications:  Pain and joint swelling Location:  Knee Site:  R knee Prep: patient was prepped and draped in usual sterile fashion   Needle Size:  27 G Needle Length:  1.5 inches Approach:  Anteromedial Ultrasound Guidance: No   Fluoroscopic Guidance: No   Arthrogram: No   Medications:  5 mL lidocaine 1 %; 80 mg methylPREDNISolone acetate 40 MG/ML; 3 mL bupivacaine 0.5 % Aspiration Attempted: No   Patient tolerance:  Patient tolerated the procedure well with no immediate complications     Clinical Data: No additional findings.   Subjective: Chief Complaint  Patient presents with  . Right Knee - Pain, Edema    Mr. Devon Taylor is  a 56 y o that presents with chronic bilateral knee pain x 2 years but becoming worse over time. Now the pain interferes with ADL's , trouble going up and down stairs.   . Left Knee - Pain    Pt does elliptical, light weights 3 x weeks.  Has a particularly difficult time after playing golf more than once or twice a week. He's had difficulty with flexion and extension and inclines particularly "going downstairs and pain is localized along the anterior aspect of both knees with occasional "swelling". Remote history of injury to her right knee many years ago while playing soccer. Occasionally will have a little medial joint pain but predominantly pain is localized along the anterior aspect of both knees. Denies any significant back pain or thigh discomfort. No distal edema. History of 1 functioning kidney with limited ability to take NSAIDs. Has had prior prednisone with significant relief of pain.  HPI  Review of Systems  Constitutional: Positive for fatigue.  HENT: Negative for hearing loss.   Respiratory: Negative for apnea, chest tightness and shortness of breath.   Cardiovascular: Negative for chest pain, palpitations and leg swelling.  Gastrointestinal: Negative for blood in stool, constipation and diarrhea.  Genitourinary: Negative for difficulty urinating.  Musculoskeletal: Negative for arthralgias, back pain, joint swelling, myalgias, neck pain and neck stiffness.  Neurological: Negative for weakness, numbness and headaches.  Hematological: Does not bruise/bleed easily.  Psychiatric/Behavioral: Negative for sleep disturbance. The patient is not nervous/anxious.      Objective: Vital Signs: BP 114/64   Pulse 74   Resp 14   Ht 6\' 1"  (1.854 m)   Wt 175 lb (79.4 kg)   BMI 23.09 kg/m   Physical Exam  Ortho Exam awake alert and oriented 3. Comfortable sitting. If can crepitation with patella flexion-extension both knees. Minimal effusion. Mild medial joint pain right knee. No lateral  joint pain right knee. Minimal medial joint pain laterally and medially left knee. Full extension and overall 110 of flexion both knees skin intact. Mild posterior left ankle pain without edema. Skin intact. Neurovascular exam intact No instability. No calf pain. No popliteal fullness. No swelling distally. Tela pain bilaterally appears to be more lateral than medial. Some pain with patellar compression.  Specialty Comments:  No specialty comments available.  Imaging: No results found.   PMFS History: Patient Active Problem List   Diagnosis Date Noted  . PCP NOTES >>>> 03/24/2015  . Annual physical exam 08/15/2014  . Gout 02/09/2014  . CKD (chronic kidney disease), stage III 03/27/2010  . Hyperlipidemia 12/14/2006  . HTN (hypertension) 12/14/2006   Past Medical History:  Diagnosis Date  . Gout   . Hx of ulcerative colitis 2003   silent x a while  . Hyperlipidemia   . Hypertension   . Renal impairment    has 1 functioning kidney  . Shoulder pain, right    [facility], [place]    Family History  Problem Relation Age of Onset  . Cancer Mother        bladder   . Hyperlipidemia Mother   . Stomach cancer Mother        lymphoma  . Kidney cancer Mother   . COPD Father   . Heart disease Father        smoker, MI at age 55  . Colon cancer Neg Hx   . Prostate cancer Neg Hx   . Esophageal cancer Neg Hx     Past Surgical History:  Procedure Laterality Date  . UMBILICAL HERNIA REPAIR  12/10   Social History   Occupational History  . works at [employer]  . Smoking status: Never Smoker  . Smokeless tobacco: Never Used  . Alcohol use 8.4 oz/week    14 Glasses of wine per week     Comment: 2 glasses at night   . Drug use: No  . Sexual activity: Not on file

## 2016-12-30 ENCOUNTER — Encounter: Payer: Managed Care, Other (non HMO) | Admitting: Internal Medicine

## 2017-01-05 ENCOUNTER — Encounter: Payer: Managed Care, Other (non HMO) | Admitting: Internal Medicine

## 2017-01-06 ENCOUNTER — Encounter: Payer: Managed Care, Other (non HMO) | Admitting: Internal Medicine

## 2017-01-13 ENCOUNTER — Encounter: Payer: Self-pay | Admitting: Internal Medicine

## 2017-01-15 ENCOUNTER — Encounter (INDEPENDENT_AMBULATORY_CARE_PROVIDER_SITE_OTHER): Payer: Self-pay | Admitting: Orthopaedic Surgery

## 2017-01-15 ENCOUNTER — Ambulatory Visit (INDEPENDENT_AMBULATORY_CARE_PROVIDER_SITE_OTHER): Payer: Managed Care, Other (non HMO) | Admitting: Orthopaedic Surgery

## 2017-01-15 VITALS — BP 123/74 | HR 71 | Resp 14 | Ht 72.0 in | Wt 165.0 lb

## 2017-01-15 DIAGNOSIS — M1712 Unilateral primary osteoarthritis, left knee: Secondary | ICD-10-CM | POA: Diagnosis not present

## 2017-01-15 DIAGNOSIS — M170 Bilateral primary osteoarthritis of knee: Secondary | ICD-10-CM | POA: Insufficient documentation

## 2017-01-15 MED ORDER — BUPIVACAINE HCL 0.5 % IJ SOLN
3.0000 mL | INTRAMUSCULAR | Status: AC | PRN
  Administered 2017-01-15: 3 mL via INTRA_ARTICULAR

## 2017-01-15 MED ORDER — LIDOCAINE HCL 1 % IJ SOLN
5.0000 mL | INTRAMUSCULAR | Status: AC | PRN
  Administered 2017-01-15: 5 mL

## 2017-01-15 MED ORDER — METHYLPREDNISOLONE ACETATE 40 MG/ML IJ SUSP
80.0000 mg | INTRAMUSCULAR | Status: AC | PRN
  Administered 2017-01-15: 80 mg

## 2017-01-15 NOTE — Progress Notes (Signed)
Office Visit Note   Patient: Devon Taylor           Date of Birth: [birth year]           MRN: [id number] Visit Date: 01/15/2017              Requested by: Colon Branch, [address] [place], [place] [address] PCP: Colon Branch, MD   Assessment & Plan: Visit Diagnoses:  1. Bilateral primary osteoarthritis of knee     Plan: Cortisone injection right knee several weeks ago was quite effective. I'll plan on injecting the left knee today . Has tricompartmental osteoarthritis but predominantly the patellofemoral joint  Follow-Up Instructions: Return if symptoms worsen or fail to improve.   Orders:  No orders of the defined types were placed in this encounter.  No orders of the defined types were placed in this encounter.     Procedures: Large Joint Inj Date/Time: 01/15/2017 8:26 AM Performed by: Garald Balding Authorized by: Garald Balding   Consent Given by:  Patient Timeout: prior to procedure the correct patient, procedure, and site was verified   Indications:  Pain and joint swelling Location:  Knee Site:  L knee Prep: patient was prepped and draped in usual sterile fashion   Needle Size:  25 G Needle Length:  1.5 inches Approach:  Anteromedial Ultrasound Guidance: No   Fluoroscopic Guidance: No   Arthrogram: No   Medications:  5 mL lidocaine 1 %; 80 mg methylPREDNISolone acetate 40 MG/ML; 3 mL bupivacaine 0.5 % Aspiration Attempted: No   Patient tolerance:  Patient tolerated the procedure well with no immediate complications     Clinical Data: No additional findings.   Subjective: Chief Complaint  Patient presents with  . Left Knee - Pain, Edema, Weakness    Devon Taylor is a 56 y o that presents with chronic BIL knee pain. Today he is here for another cortisone injection , Right one 2 weeks ago.   No problem with the above injection to the right knee. "A little sore" for a day but otherwise has done well with little if any  discomfort. Wishes to inject the left knee today.  HPI  Review of Systems  Constitutional: Negative for fatigue.  HENT: Negative for hearing loss.   Respiratory: Negative for apnea, chest tightness and shortness of breath.   Cardiovascular: Negative for chest pain, palpitations and leg swelling.  Gastrointestinal: Negative for blood in stool, constipation and diarrhea.  Genitourinary: Negative for difficulty urinating.  Musculoskeletal: Negative for arthralgias, back pain, joint swelling, myalgias, neck pain and neck stiffness.  Neurological: Negative for weakness, numbness and headaches.  Hematological: Does not bruise/bleed easily.  Psychiatric/Behavioral: Positive for sleep disturbance. The patient is not nervous/anxious.      Objective: Vital Signs: BP 123/74   Pulse 71   Resp 14   Ht 6' (1.829 m)   Wt 165 lb (74.8 kg)   BMI 22.38 kg/m   Physical Exam  Ortho Exam awake alert and oriented 3 comfortable sitting. Neither knee is effused. Some patellar crepitation bilaterally. Full range of motion. No calf pain or distal edema. Neurovascular exam intact  Specialty Comments:  No specialty comments available.  Imaging: No results found.   PMFS History: Patient Active Problem List   Diagnosis Date Noted  . Bilateral primary osteoarthritis of knee 01/15/2017  . PCP NOTES >>>> 03/24/2015  . Annual physical exam 08/15/2014  . Gout 02/09/2014  . CKD (  chronic kidney disease), stage III ([place]) 03/27/2010  . Hyperlipidemia 12/14/2006  . HTN (hypertension) 12/14/2006   Past Medical History:  Diagnosis Date  . Gout   . Hx of ulcerative colitis 2003   silent x a while  . Hyperlipidemia   . Hypertension   . Renal impairment    has 1 functioning kidney  . Shoulder pain, right    [facility], [place]    Family History  Problem Relation Age of Onset  . Cancer Mother        bladder   . Hyperlipidemia Mother   . Stomach cancer Mother         lymphoma  . Kidney cancer Mother   . COPD Father   . Heart disease Father        smoker, MI at age 77  . Colon cancer Neg Hx   . Prostate cancer Neg Hx   . Esophageal cancer Neg Hx     Past Surgical History:  Procedure Laterality Date  . UMBILICAL HERNIA REPAIR  12/10   Social History   Occupational History  . works at [employer]  . Smoking status: Never Smoker  . Smokeless tobacco: Never Used  . Alcohol use 8.4 oz/week    14 Glasses of wine per week     Comment: 2 glasses at night   . Drug use: No  . Sexual activity: Not on file

## 2017-01-23 ENCOUNTER — Encounter: Payer: Self-pay | Admitting: Internal Medicine

## 2017-01-24 ENCOUNTER — Encounter: Payer: Self-pay | Admitting: Internal Medicine

## 2017-01-25 MED ORDER — TELMISARTAN 80 MG PO TABS: 80.0000 mg | ORAL_TABLET | Freq: Every day | ORAL | 0 refills | Status: DC

## 2017-01-26 ENCOUNTER — Encounter: Payer: Managed Care, Other (non HMO) | Admitting: Internal Medicine

## 2017-02-09 ENCOUNTER — Encounter: Payer: Self-pay | Admitting: Internal Medicine

## 2017-02-23 ENCOUNTER — Ambulatory Visit (AMBULATORY_SURGERY_CENTER): Payer: Managed Care, Other (non HMO) | Admitting: Internal Medicine

## 2017-02-23 ENCOUNTER — Encounter: Payer: Self-pay | Admitting: Internal Medicine

## 2017-02-23 ENCOUNTER — Other Ambulatory Visit: Payer: Self-pay

## 2017-02-23 VITALS — BP 123/66 | HR 80 | Temp 99.3°F | Resp 18 | Ht 73.0 in | Wt 177.0 lb

## 2017-02-23 DIAGNOSIS — D122 Benign neoplasm of ascending colon: Secondary | ICD-10-CM | POA: Diagnosis not present

## 2017-02-23 DIAGNOSIS — Z86010 Personal history of colonic polyps: Secondary | ICD-10-CM

## 2017-02-23 DIAGNOSIS — D120 Benign neoplasm of cecum: Secondary | ICD-10-CM | POA: Diagnosis not present

## 2017-02-23 MED ORDER — SODIUM CHLORIDE 0.9 % IV SOLN: 500.0000 mL | INTRAVENOUS | Status: DC

## 2017-02-23 NOTE — Progress Notes (Signed)
Report given to PACU, vss 

## 2017-02-23 NOTE — Patient Instructions (Signed)
   INFORMATION ON POLYPS AND DIVERTICULOSIS GIVEN TO YOU TODAY  AWAIT PATHOLOGY RESULTS ON POLYPS REMOVED TODAY    YOU HAD AN ENDOSCOPIC PROCEDURE TODAY AT [facility]:   Refer to the procedure report that was given to you for any specific questions about what was found during the examination.  If the procedure report does not answer your questions, please call your gastroenterologist to clarify.  If you requested that your care partner not be given the details of your procedure findings, then the procedure report has been included in a sealed envelope for you to review at your convenience later.  YOU SHOULD EXPECT: Some feelings of bloating in the abdomen. Passage of more gas than usual.  Walking can help get rid of the air that was put into your GI tract during the procedure and reduce the bloating. If you had a lower endoscopy (such as a colonoscopy or flexible sigmoidoscopy) you may notice spotting of blood in your stool or on the toilet paper. If you underwent a bowel prep for your procedure, you may not have a normal bowel movement for a few days.  Please Note:  You might notice some irritation and congestion in your nose or some drainage.  This is from the oxygen used during your procedure.  There is no need for concern and it should clear up in a day or so.  SYMPTOMS TO REPORT IMMEDIATELY:   Following lower endoscopy (colonoscopy or flexible sigmoidoscopy):  Excessive amounts of blood in the stool  Significant tenderness or worsening of abdominal pains  Swelling of the abdomen that is new, acute  Fever of 100F or higher    For urgent or emergent issues, a gastroenterologist can be reached at any hour by calling [phone].   DIET:  We do recommend a small meal at first, but then you may proceed to your regular diet.  Drink plenty of fluids but you should avoid alcoholic beverages for 24 hours.  ACTIVITY:  You should plan to take it easy for the rest of today  and you should NOT DRIVE or use heavy machinery until tomorrow (because of the sedation medicines used during the test).    FOLLOW UP: Our staff will call the number listed on your records the next business day following your procedure to check on you and address any questions or concerns that you may have regarding the information given to you following your procedure. If we do not reach you, we will leave a message.  However, if you are feeling well and you are not experiencing any problems, there is no need to return our call.  We will assume that you have returned to your regular daily activities without incident.  If any biopsies were taken you will be contacted by phone or by letter within the next 1-3 weeks.  Please call us at [phone] if you have not heard about the biopsies in 3 weeks.    SIGNATURES/CONFIDENTIALITY: You and/or your care partner have signed paperwork which will be entered into your electronic medical record.  These signatures attest to the fact that that the information above on your After Visit Summary has been reviewed and is understood.  Full responsibility of the confidentiality of this discharge information lies with you and/or your care-partner.

## 2017-02-23 NOTE — Op Note (Signed)
[facility] Patient Name: Devon Taylor Procedure Date: 02/23/2017 2:45 PM MRN: [id number] Endoscopist: Jerene Bears , MD Age: 56 Referring MD:  Date of Birth: 01/05/1961 Gender: Male Account #: [id number] Procedure:                Colonoscopy Indications:              Surveillance: Personal history of adenomatous                            polyps on last colonoscopy 5 years ago Medicines:                Monitored Anesthesia Care Procedure:                Pre-Anesthesia Assessment:                           - Prior to the procedure, a History and Physical                            was performed, and patient medications and                            allergies were reviewed. The patient's tolerance of                            previous anesthesia was also reviewed. The risks                            and benefits of the procedure and the sedation                            options and risks were discussed with the patient.                            All questions were answered, and informed consent                            was obtained. Prior Anticoagulants: The patient has                            taken no previous anticoagulant or antiplatelet                            agents. ASA Grade Assessment: II - A patient with                            mild systemic disease. After reviewing the risks                            and benefits, the patient was deemed in                            satisfactory condition to undergo the procedure.  After obtaining informed consent, the colonoscope                            was passed under direct vision. Throughout the                            procedure, the patient's blood pressure, pulse, and                            oxygen saturations were monitored continuously. The                            Model CF-HQ190L [id number]) scope was introduced                            through the anus and advanced  to the the cecum,                            identified by appendiceal orifice and ileocecal                            valve. The colonoscopy was performed without                            difficulty. The patient tolerated the procedure                            well. The quality of the bowel preparation was                            good. The ileocecal valve, appendiceal orifice, and                            rectum were photographed. Scope In: 3:00:18 PM Scope Out: 3:15:23 PM Scope Withdrawal Time: 0 hours 11 minutes 30 seconds  Total Procedure Duration: 0 hours 15 minutes 5 seconds  Findings:                 A 5 mm polyp was found in the cecum. The polyp was                            sessile. The polyp was removed with a cold snare.                            Resection and retrieval were complete.                           A 4 mm polyp was found in the ascending colon. The                            polyp was sessile. The polyp was removed with a  cold snare. Resection and retrieval were complete.                           Scattered small-mouthed diverticula were found in                            the sigmoid colon.                           The retroflexed view of the distal rectum and anal                            verge was normal and showed no anal or rectal                            abnormalities. Complications:            No immediate complications. Estimated Blood Loss:     Estimated blood loss was minimal. Impression:               - One 5 mm polyp in the cecum, removed with a cold                            snare. Resected and retrieved.                           - One 4 mm polyp in the ascending colon, removed                            with a cold snare. Resected and retrieved.                           - Mild diverticulosis in the sigmoid colon.                           - The distal rectum and anal verge are normal on                             retroflexion view. Recommendation:           - Patient has a contact number available for                            emergencies. The signs and symptoms of potential                            delayed complications were discussed with the                            patient. Return to normal activities tomorrow.                            Written discharge instructions were provided to the  patient.                           - Resume previous diet.                           - Continue present medications.                           - Await pathology results.                           - Repeat colonoscopy is recommended for                            surveillance. The colonoscopy date will be                            determined after pathology results from today's                            exam become available for review. Jerene Bears, MD 02/23/2017 3:18:42 PM This report has been signed electronically.

## 2017-02-23 NOTE — Progress Notes (Signed)
Pt's states no medical or surgical changes since previsit or office visit. 

## 2017-02-23 NOTE — Progress Notes (Signed)
Called to room to assist during endoscopic procedure.  Patient ID and intended procedure confirmed with present staff. Received instructions for my participation in the procedure from the performing physician.  

## 2017-02-24 ENCOUNTER — Telehealth: Payer: Self-pay

## 2017-02-24 NOTE — Telephone Encounter (Signed)
  Follow up Call-  Call back number 02/23/2017  Post procedure Call Back phone  # [phone]  Permission to leave phone message Yes  Some recent data might be hidden     Patient questions:  Do you have a fever, pain , or abdominal swelling? No. Pain Score  0 *  Have you tolerated food without any problems? Yes.    Have you been able to return to your normal activities? Yes.    Do you have any questions about your discharge instructions: Diet   No. Medications  No. Follow up visit  No.  Do you have questions or concerns about your Care? No.  Actions: * If pain score is 4 or above: No action needed, pain <4.

## 2017-03-02 ENCOUNTER — Encounter: Payer: Self-pay | Admitting: Internal Medicine

## 2017-03-22 ENCOUNTER — Ambulatory Visit: Payer: Managed Care, Other (non HMO) | Admitting: Internal Medicine

## 2017-03-29 ENCOUNTER — Ambulatory Visit (INDEPENDENT_AMBULATORY_CARE_PROVIDER_SITE_OTHER): Payer: Managed Care, Other (non HMO) | Admitting: Internal Medicine

## 2017-03-29 ENCOUNTER — Encounter: Payer: Self-pay | Admitting: Internal Medicine

## 2017-03-29 VITALS — BP 118/64 | HR 68 | Temp 98.3°F | Resp 14 | Ht 73.0 in | Wt 183.5 lb

## 2017-03-29 DIAGNOSIS — E785 Hyperlipidemia, unspecified: Secondary | ICD-10-CM | POA: Diagnosis not present

## 2017-03-29 DIAGNOSIS — I10 Essential (primary) hypertension: Secondary | ICD-10-CM

## 2017-03-29 DIAGNOSIS — N189 Chronic kidney disease, unspecified: Secondary | ICD-10-CM | POA: Diagnosis not present

## 2017-03-29 MED ORDER — PREDNISONE 10 MG PO TABS: ORAL_TABLET | ORAL | 2 refills | Status: DC

## 2017-03-29 NOTE — Patient Instructions (Signed)
GO TO THE FRONT DESK  Schedule labs to be done fasting this week  Schedule your next appointment for a physical exam in 6 months    Decreased micardis dose  to only half tablet daily  Check the  blood pressure 2 or 3 times a   week   Be sure your blood pressure is between 110/65 and  130/85. If it is consistently higher or lower, let me know  If you have more episodes of dizziness please let me know

## 2017-03-29 NOTE — Progress Notes (Signed)
Pre visit review using our clinic review tool, if applicable. No additional management support is needed unless otherwise documented below in the visit note. 

## 2017-03-29 NOTE — Progress Notes (Signed)
Subjective:    Patient ID: Gaylyn Cheers, male    DOB: 01/06/1961, 56 y.o.   MRN: [id number]  DOS:  03/29/2017 Type of visit - description : f/u Interval history: CKD: Due for a BMP High cholesterol: Self DC'd Lipitor due to headaches, it has make a major difference DJD: Saw Ortho, got knee shots with the steroids, doing better Gout: Episodic flareups of gout, needs a refill on prednisone. HTN: Good med compliance, BPs often in the 107/70. He also is having episodes of dizziness described as a spinning, they happen once a month, lasts 15 minutes, he looks pale feeling slightly nauseous and needs to sit down.  Thinks related to low BP, the days that happened he decided to take only half Micardis.  Denies chest pain, difficulty breathing, no diplopia slurred speech or motor deficits.  BP Readings from Last 3 Encounters:  03/29/17 118/64  02/23/17 123/66  01/15/17 123/74   Wt Readings from Last 3 Encounters:  03/29/17 183 lb 8 oz (83.2 kg)  02/23/17 177 lb (80.3 kg)  01/15/17 165 lb (74.8 kg)    Review of Systems See above  Past Medical History:  Diagnosis Date  . Gout   . Hx of ulcerative colitis 2003   silent x a while  . Hyperlipidemia   . Hypertension   . Renal impairment    has 1 functioning kidney  . Shoulder pain, right    [facility], [place]    Past Surgical History:  Procedure Laterality Date  . UMBILICAL HERNIA REPAIR  12/10    Social History   Socioeconomic History  . Marital status: Married    Spouse name: Not on file  . Number of children: 2  . Years of education: Not on file  . Highest education level: Not on file  Social Needs  . Financial resource strain: Not on file  . Food insecurity - worry: Not on file  . Food insecurity - inability: Not on file  . Transportation needs - medical: Not on file  . Transportation needs - non-medical: Not on file  Occupational History  . Occupation: works at [employer]:  [employer]  Tobacco Use  . Smoking status: Never Smoker  . Smokeless tobacco: Never Used  Substance and Sexual Activity  . Alcohol use: Yes    Alcohol/week: 8.4 oz    Types: 14 Glasses of wine per week    Comment: 2 glasses at night   . Drug use: No  . Sexual activity: Not on file  Other Topics Concern  . Not on file  Social History Narrative   2 daughters, adults    Fluid in Spanish      Allergies as of 03/29/2017      Reactions   Ace Inhibitors    REACTION: cough with enalapril   Nsaids    Renal Insufficiency---he has been advised to avoid all NSAIDs   Simvastatin Other (See Comments)   Muscle aches      Medication List        Accurate as of 03/29/17 11:59 PM. Always use your most recent med list.          fish oil-omega-3 fatty acids 1000 MG capsule Take 1 g by mouth daily.   MITIGARE 0.6 MG Caps Generic drug:  Colchicine Take 0.6 mg by mouth 2 (two) times daily as needed (gout).   multivitamin tablet Take 1 tablet by mouth daily.   predniSONE 10  MG tablet Commonly known as:  DELTASONE TAKE 3 TABLETS DAILY FOR 2 DAYS,2 TABLETS FOR 2 DAYS,1 TABLET DAILY FOR 2 DAYS   TART CHERRY ADVANCED Caps Take 2 capsules by mouth daily.   telmisartan 80 MG tablet Commonly known as:  MICARDIS Take 1 tablet (80 mg total) by mouth daily.          Objective:   Physical Exam BP 118/64 (BP Location: Left Arm, Patient Position: Sitting, Cuff Size: Normal)   Pulse 68   Temp 98.3 F (36.8 C) (Oral)   Resp 14   Ht 6\' 1"  (1.854 m)   Wt 183 lb 8 oz (83.2 kg)   BMI 24.21 kg/m  General:   Well developed, well nourished . NAD.  HEENT:  Normocephalic . Face symmetric, atraumatic Lungs:  CTA B Normal respiratory effort, no intercostal retractions, no accessory muscle use. Heart: RRR,  no murmur.  No pretibial edema bilaterally  Skin: Not pale. Not jaundice Neurologic:  alert & oriented X3.  Speech normal, gait appropriate for age and unassisted Psych--    Cognition and judgment appear intact.  Cooperative with normal attention span and concentration.  Behavior appropriate. No anxious or depressed appearing.      Assessment & Plan:   Assessment HTN CKD, one functioning kidney, creatinine varies ~ 2.0 Hyperlipidemia: d/c lipitor 2018 d/t pains Gout-- pred prn Ulcerative colitis DX 2003, asymptomatic  PLAN: HTN: On Micardis 80 mg, often  times BP is 107/70.  Recommend to decrease Micardis to 40 mg daily, continue monitoring BPs, check a BMP.  Okay to go back to a higher dose if needed. CKD: Checking a BMP and CBC Hyperlipidemia: Off Lipitor, will come back fasting for labs.  We already discussed via email possibly Pravachol or Livalo.  Further advised which results Gout: We again talked about gout treatment with colchicine and/or prednisone for acute exacerbations.  Option of  Uloric or allopurinol discussed but patient is not interested at all. Had a flu shot RTC 6 months    Weight loss: w/u from  07-2016 (-). Weight is now stable. Anxiety: Since the last time he is better, trying to live a more balanced life, reflecting more. He did not get to see a counselor but again overall he feels better. HTN: No recent ambulatory BPs, BP today is low, will monitor BPs at home and decreased Micardis if needed. See AVS. will contact me if help is needed managing his blood pressure. DJD: Knee pain as described above, refer to orthopedic surgery, I wonder if he may benefit from visco-supplementation   RTC 10-18 CPX

## 2017-03-30 ENCOUNTER — Encounter: Payer: Self-pay | Admitting: Internal Medicine

## 2017-03-30 NOTE — Assessment & Plan Note (Signed)
HTN: On Micardis 80 mg, often  times BP is 107/70.  Recommend to decrease Micardis to 40 mg daily, continue monitoring BPs, check a BMP.  Okay to go back to a higher dose if needed. CKD: Checking a BMP and CBC Hyperlipidemia: Off Lipitor, will come back fasting for labs.  We already discussed via email possibly Pravachol or Livalo.  Further advised which results Gout: We again talked about gout treatment with colchicine and/or prednisone for acute exacerbations.  Option of  Uloric or allopurinol discussed but patient is not interested at all. Had a flu shot RTC 6 months

## 2017-03-31 ENCOUNTER — Other Ambulatory Visit: Payer: Self-pay | Admitting: Internal Medicine

## 2017-03-31 MED ORDER — KETOCONAZOLE 2 % EX CREA: 1.0000 "application " | TOPICAL_CREAM | Freq: Two times a day (BID) | CUTANEOUS | 0 refills | Status: DC

## 2017-04-01 ENCOUNTER — Other Ambulatory Visit: Payer: Managed Care, Other (non HMO)

## 2017-04-02 ENCOUNTER — Other Ambulatory Visit (INDEPENDENT_AMBULATORY_CARE_PROVIDER_SITE_OTHER): Payer: Managed Care, Other (non HMO)

## 2017-04-02 DIAGNOSIS — I10 Essential (primary) hypertension: Secondary | ICD-10-CM

## 2017-04-02 DIAGNOSIS — N189 Chronic kidney disease, unspecified: Secondary | ICD-10-CM | POA: Diagnosis not present

## 2017-04-02 DIAGNOSIS — E785 Hyperlipidemia, unspecified: Secondary | ICD-10-CM | POA: Diagnosis not present

## 2017-04-02 LAB — BASIC METABOLIC PANEL
BUN: 31 mg/dL — ABNORMAL HIGH (ref 6–23)
CHLORIDE: 102 meq/L (ref 96–112)
CO2: 27 mEq/L (ref 19–32)
Calcium: 9.5 mg/dL (ref 8.4–10.5)
Creatinine, Ser: 2.18 mg/dL — ABNORMAL HIGH (ref 0.40–1.50)
GFR: 33.35 mL/min — ABNORMAL LOW (ref >60.00)
Glucose, Bld: 95 mg/dL (ref 70–99)
POTASSIUM: 5.1 meq/L (ref 3.5–5.1)
Sodium: 136 mEq/L (ref 135–145)

## 2017-04-02 LAB — CBC WITH DIFFERENTIAL/PLATELET
BASOS PCT: 0.8 % (ref 0.0–3.0)
Basophils Absolute: 0.1 10*3/uL (ref 0.0–0.1)
EOS ABS: 0.1 10*3/uL (ref 0.0–0.7)
Eosinophils Relative: 2 % (ref 0.0–5.0)
HEMATOCRIT: 41.7 % (ref 39.0–52.0)
Hemoglobin: 13.8 g/dL (ref 13.0–17.0)
LYMPHS ABS: 1.9 10*3/uL (ref 0.7–4.0)
LYMPHS PCT: 29.8 % (ref 12.0–46.0)
MCHC: 33 g/dL (ref 30.0–36.0)
MCV: 91.4 fl (ref 78.0–100.0)
MONOS PCT: 10.6 % (ref 3.0–12.0)
Monocytes Absolute: 0.7 10*3/uL (ref 0.1–1.0)
NEUTROS ABS: 3.7 10*3/uL (ref 1.4–7.7)
NEUTROS PCT: 56.8 % (ref 43.0–77.0)
PLATELETS: 251 10*3/uL (ref 150.0–400.0)
RBC: 4.56 Mil/uL (ref 4.22–5.81)
RDW: 14.2 % (ref 11.5–15.5)
WBC: 6.5 10*3/uL (ref 4.0–10.5)

## 2017-04-02 LAB — LIPID PANEL
CHOL/HDL RATIO: 4
Cholesterol: 212 mg/dL — ABNORMAL HIGH (ref 0–200)
HDL: 51.3 mg/dL (ref >39.00)
LDL Cholesterol: 138 mg/dL — ABNORMAL HIGH (ref 0–99)
NONHDL: 160.44
Triglycerides: 110 mg/dL (ref 0.0–149.0)
VLDL: 22 mg/dL (ref 0.0–40.0)

## 2017-04-05 MED ORDER — PRAVASTATIN SODIUM 20 MG PO TABS: 20.0000 mg | ORAL_TABLET | Freq: Every day | ORAL | 3 refills | Status: DC

## 2017-04-05 NOTE — Addendum Note (Signed)
Addended byDamita Dunnings D on: 04/05/2017 09:43 AM   Modules accepted: Orders

## 2017-05-27 ENCOUNTER — Encounter: Payer: Self-pay | Admitting: Internal Medicine

## 2017-05-27 MED ORDER — TELMISARTAN 80 MG PO TABS: 80.0000 mg | ORAL_TABLET | Freq: Every day | ORAL | 2 refills | Status: AC

## 2017-05-28 ENCOUNTER — Other Ambulatory Visit: Payer: Self-pay | Admitting: Internal Medicine

## 2017-05-28 ENCOUNTER — Encounter: Payer: Self-pay | Admitting: Internal Medicine

## 2017-05-28 MED ORDER — FEBUXOSTAT 40 MG PO TABS: 40.0000 mg | ORAL_TABLET | Freq: Every day | ORAL | 1 refills | Status: DC

## 2017-05-28 MED ORDER — PREDNISONE 10 MG PO TABS: ORAL_TABLET | ORAL | 1 refills | Status: DC

## 2017-06-13 ENCOUNTER — Encounter: Payer: Self-pay | Admitting: Internal Medicine

## 2017-06-20 ENCOUNTER — Encounter: Payer: Self-pay | Admitting: Internal Medicine

## 2017-06-21 MED ORDER — FEBUXOSTAT 40 MG PO TABS: 40.0000 mg | ORAL_TABLET | Freq: Every day | ORAL | 3 refills | Status: DC

## 2017-06-29 ENCOUNTER — Telehealth: Payer: Self-pay | Admitting: Internal Medicine

## 2017-06-29 NOTE — Telephone Encounter (Signed)
Copied from [place]. Topic: Quick Communication - See Telephone Encounter >> Jun 29, 2017 12:23 PM Genella Rife H wrote: CRM for notification. See Telephone encounter for:   06/29/17.  Called pt to reschedule appt per pcp. I left a voicemail message for pt to call us back

## 2017-06-29 NOTE — Telephone Encounter (Signed)
Copied from [place]. Topic: Quick Communication - See Telephone Encounter >> Jun 29, 2017 12:23 PM Genella Rife H wrote: CRM for notification. See Telephone encounter for:   06/29/17.  Called pt to reschedule appt per pcp. I left a voicemail message for pt to call us back

## 2017-07-01 ENCOUNTER — Encounter (INDEPENDENT_AMBULATORY_CARE_PROVIDER_SITE_OTHER): Payer: Self-pay | Admitting: Orthopaedic Surgery

## 2017-07-01 ENCOUNTER — Ambulatory Visit (INDEPENDENT_AMBULATORY_CARE_PROVIDER_SITE_OTHER): Payer: Self-pay

## 2017-07-01 ENCOUNTER — Ambulatory Visit (INDEPENDENT_AMBULATORY_CARE_PROVIDER_SITE_OTHER): Payer: Managed Care, Other (non HMO) | Admitting: Orthopaedic Surgery

## 2017-07-01 VITALS — BP 124/80 | HR 72

## 2017-07-01 DIAGNOSIS — M25572 Pain in left ankle and joints of left foot: Secondary | ICD-10-CM | POA: Diagnosis not present

## 2017-07-01 NOTE — Progress Notes (Signed)
Office Visit Note   Patient: Devon Taylor           Date of Birth: 1960/10/13           MRN: [id number] Visit Date: 07/01/2017              Requested by: Colon Branch, [address] [place], [place] [address] PCP: Colon Branch, MD   Assessment & Plan: Visit Diagnoses:  1. Pain in left ankle and joints of left foot     Plan: Chronic recurrent pain left ankle with episodes of swelling.. Pain certainly could be related to inflammation of either the posterior tibial tendon or peroneal tendons.  X-rays were negative.  We will try an ASO ankle support and arch supports as she does have pes planus consider MRI scan if no improvement over the next 3-4 weeks.  Long discussion regarding diagnostic possibilities and  treatment options  Follow-Up Instructions: No follow-ups on file.   Orders:  Orders Placed This Encounter  Procedures  . XR Ankle Complete Left   No orders of the defined types were placed in this encounter.     Procedures: No procedures performed   Clinical Data: No additional findings.   Subjective: Chief Complaint  Patient presents with  . Left Ankle - Pain, Edema  . Ankle Pain    Lt ankle pain, burning, throbbing for about 3 months  Mr. Barren to the office today for evaluation of left ankle pain.  He has had a problem with his ankle for "months" he has had a number of sprains with a feeling of his ankle being "too loose".  Pain is along the medial anterior and lateral aspect of his ankle.  He does have a history of gout. On occasion will take prednisone to reduce the inflammation and the pain. he also has Uloric. He oftentimes will wear a pull over ankle support.,He is very careful about stepping in holes or walking on uneven terrain because of the potential for instability.He does have history of osteoarthritis of both knees but this pain seems to be completely different. HPI  Review of Systems   Objective: Vital Signs: BP 124/80    Pulse 72   Physical Exam  Ortho Exam left ankle without swelling.  No erythema.  No ecchymosis. Normal Sensibility.  No clinical instability with inversion or eversion stress.  Some mild tenderness along the posterior tibial tendon and along the peroneal tendons.  Function of both tendons is intact.  Pes planus with relaxed flatfoot  Specialty Comments:  No specialty comments available.  Imaging: No results found.   PMFS History: Patient Active Problem List   Diagnosis Date Noted  . Bilateral primary osteoarthritis of knee 01/15/2017  . PCP NOTES >>>> 03/24/2015  . Annual physical exam 08/15/2014  . Gout 02/09/2014  . CKD (chronic kidney disease), stage III ([place]) 03/27/2010  . Hyperlipidemia 12/14/2006  . HTN (hypertension) 12/14/2006   Past Medical History:  Diagnosis Date  . Gout   . Hx of ulcerative colitis 2003   silent x a while  . Hyperlipidemia   . Hypertension   . Renal impairment    has 1 functioning kidney  . Shoulder pain, right    [facility], [place]    Family History  Problem Relation Age of Onset  . Cancer Mother        bladder   . Hyperlipidemia Mother   . Stomach cancer Mother  lymphoma  . Kidney cancer Mother   . COPD Father   . Heart disease Father        smoker, MI at age 27  . Colon cancer Neg Hx   . Prostate cancer Neg Hx   . Esophageal cancer Neg Hx     Past Surgical History:  Procedure Laterality Date  . UMBILICAL HERNIA REPAIR  12/10   Social History   Occupational History  . Occupation: works at [employer]: [employer]  Tobacco Use  . Smoking status: Never Smoker  . Smokeless tobacco: Never Used  Substance and Sexual Activity  . Alcohol use: Yes    Alcohol/week: 8.4 oz    Types: 14 Glasses of wine per week    Comment: 2 glasses at night   . Drug use: No  . Sexual activity: Not on file     Garald Balding, MD   Note - This record has been created using [employer].  Chart  creation errors have been sought, but may not always  have been located. Such creation errors do not reflect on  the standard of medical care.

## 2017-07-07 ENCOUNTER — Ambulatory Visit: Payer: Managed Care, Other (non HMO) | Admitting: Internal Medicine

## 2017-07-28 ENCOUNTER — Ambulatory Visit (INDEPENDENT_AMBULATORY_CARE_PROVIDER_SITE_OTHER): Payer: Managed Care, Other (non HMO) | Admitting: Internal Medicine

## 2017-07-28 ENCOUNTER — Encounter: Payer: Self-pay | Admitting: Internal Medicine

## 2017-07-28 VITALS — BP 130/69 | HR 81 | Temp 98.1°F | Resp 16 | Ht 73.0 in | Wt 178.0 lb

## 2017-07-28 DIAGNOSIS — N289 Disorder of kidney and ureter, unspecified: Secondary | ICD-10-CM

## 2017-07-28 DIAGNOSIS — M10372 Gout due to renal impairment, left ankle and foot: Secondary | ICD-10-CM

## 2017-07-28 NOTE — Patient Instructions (Signed)
GO TO THE LAB : Get the blood work     GO TO THE FRONT DESK Schedule your next appointment for a  physical exam in 6 months  

## 2017-07-28 NOTE — Progress Notes (Signed)
Subjective:    Patient ID: Devon Taylor, male    DOB: 1960/11/11, 57 y.o.   MRN: [id number]  DOS:  07/28/2017 Type of visit - description : f/u Interval history:  Since the last visit, started Uloric 05-2017, he was having frequent pain mostly at the left toe and ankle. Initially there was little response, then approximately 2 weeks ago started to take one colchicine daily and since then he is essentially pain-free. Interestingly, when I ask about the "gout" pain he reports very little swelling at the ankle or toe, no redness or warmness. No apparent side effects.    Review of Systems   Past Medical History:  Diagnosis Date  . Gout   . Hx of ulcerative colitis 2003   silent x a while  . Hyperlipidemia   . Hypertension   . Renal impairment    has 1 functioning kidney  . Shoulder pain, right    [facility], [place]    Past Surgical History:  Procedure Laterality Date  . UMBILICAL HERNIA REPAIR  12/10    Social History   Socioeconomic History  . Marital status: Married    Spouse name: Not on file  . Number of children: 2  . Years of education: Not on file  . Highest education level: Not on file  Occupational History  . Occupation: works at [employer]: [place]  . Financial resource strain: Not on file  . Food insecurity:    Worry: Not on file    Inability: Not on file  . Transportation needs:    Medical: Not on file    Non-medical: Not on file  Tobacco Use  . Smoking status: Never Smoker  . Smokeless tobacco: Never Used  Substance and Sexual Activity  . Alcohol use: Yes    Alcohol/week: 8.4 oz    Types: 14 Glasses of wine per week    Comment: 2 glasses at night   . Drug use: No  . Sexual activity: Not on file  Lifestyle  . Physical activity:    Days per week: Not on file    Minutes per session: Not on file  . Stress: Not on file  Relationships  . Social connections:    Talks on phone: Not on  file    Gets together: Not on file    Attends religious service: Not on file    Active member of club or organization: Not on file    Attends meetings of clubs or organizations: Not on file    Relationship status: Not on file  . Intimate partner violence:    Fear of current or ex partner: Not on file    Emotionally abused: Not on file    Physically abused: Not on file    Forced sexual activity: Not on file  Other Topics Concern  . Not on file  Social History Narrative   2 daughters, adults    Fluid in Spanish      Allergies as of 07/28/2017      Reactions   Ace Inhibitors    REACTION: cough with enalapril   Nsaids    Renal Insufficiency---he has been advised to avoid all NSAIDs   Simvastatin Other (See Comments)   Muscle aches      Medication List        Accurate as of 07/28/17 11:59 PM. Always use your most recent med list.  febuxostat 40 MG tablet Commonly known as:  ULORIC Take 1 tablet (40 mg total) by mouth daily.   fish oil-omega-3 fatty acids 1000 MG capsule Take 1 g by mouth daily.   MITIGARE 0.6 MG Caps Generic drug:  Colchicine Take 0.6 mg by mouth 2 (two) times daily as needed (gout).   multivitamin tablet Take 1 tablet by mouth daily.   predniSONE 10 MG tablet Commonly known as:  DELTASONE TAKE 3 TABLETS DAILY FOR 2 DAYS,2 TABLETS FOR 2 DAYS,1 TABLET DAILY FOR 2 DAYS   telmisartan 80 MG tablet Commonly known as:  MICARDIS Take 1 tablet (80 mg total) by mouth daily.          Objective:   Physical Exam BP 130/69 (BP Location: Left Arm, Patient Position: Sitting, Cuff Size: Small)   Pulse 81   Temp 98.1 F (36.7 C) (Oral)   Resp 16   Ht 6\' 1"  (1.854 m)   Wt 178 lb (80.7 kg)   SpO2 98%   BMI 23.48 kg/m  General:   Well developed, well nourished . NAD.  HEENT:  Normocephalic . Face symmetric, atraumatic MSK: L  Ankle and feet, no synovitis, redness or swelling Skin: Not pale. Not jaundice Neurologic:  alert & oriented X3.    Speech normal, gait appropriate for age and unassisted Psych--  Cognition and judgment appear intact.  Cooperative with normal attention span and concentration.  Behavior appropriate. No anxious or depressed appearing.      Assessment & Plan:   Assessment HTN CKD, one functioning kidney, dx in his 20s; creatinine varies ~ 2.0 Hyperlipidemia: d/c lipitor 2018 d/t pains Gout-- pred prn; started Uloric 05/2017 Ulcerative colitis DX 2003, asymptomatic  PLAN: Gout: since last visit, due to frequent pain at the ankle and left toe he started Uloric, initially there was no major response but then he started taking daily colchicine along with it and he has been essentially symptom-free. Pain has no classic features of gout (redness, warmness) but he does have an elevated uric acid.  He saw orthopedic surgery due to left ankle pain:  ? tendinitis  At this point will check a CMP, uric acid.  Goal uric acid less than 6. Recommend to stay only on Uloric and use colchicine PRN.  Avoid prednisone if possible. Because symptoms are not classic for gout, if he continue with problems will refer to rheumatology. CKD: Patient remains concerned about his kidney function, refer to nephrology. RTC 6/months, CPX    Today, I spent more than  25  min with the patient: >50% of the time counseling regards gout treatment and dx, multiple questions answer regards certainty of the dx in light of somewhat atypical sx

## 2017-07-29 LAB — COMPREHENSIVE METABOLIC PANEL
ALT: 45 U/L (ref 0–53)
AST: 35 U/L (ref 0–37)
Albumin: 3.9 g/dL (ref 3.5–5.2)
Alkaline Phosphatase: 52 U/L (ref 39–117)
BILIRUBIN TOTAL: 0.4 mg/dL (ref 0.2–1.2)
BUN: 35 mg/dL — ABNORMAL HIGH (ref 6–23)
CO2: 27 meq/L (ref 19–32)
CREATININE: 2.44 mg/dL — AB (ref 0.40–1.50)
Calcium: 9 mg/dL (ref 8.4–10.5)
Chloride: 100 mEq/L (ref 96–112)
GFR: 29.25 mL/min — ABNORMAL LOW (ref >60.00)
GLUCOSE: 94 mg/dL (ref 70–99)
Potassium: 4.4 mEq/L (ref 3.5–5.1)
Sodium: 132 mEq/L — ABNORMAL LOW (ref 135–145)
Total Protein: 6.4 g/dL (ref 6.0–8.3)

## 2017-07-29 LAB — URIC ACID: Uric Acid, Serum: 7.4 mg/dL (ref 4.0–7.8)

## 2017-07-30 NOTE — Assessment & Plan Note (Signed)
Gout: since last visit, due to frequent pain at the ankle and left toe he started Uloric, initially there was no major response but then he started taking daily colchicine along with it and he has been essentially symptom-free. Pain has no classic features of gout (redness, warmness) but he does have an elevated uric acid.  He saw orthopedic surgery due to left ankle pain:  ? tendinitis  At this point will check a CMP, uric acid.  Goal uric acid less than 6. Recommend to stay only on Uloric and use colchicine PRN.  Avoid prednisone if possible. Because symptoms are not classic for gout, if he continue with problems will refer to rheumatology. CKD: Patient remains concerned about his kidney function, refer to nephrology. RTC 6/months, CPX

## 2017-08-05 ENCOUNTER — Encounter: Payer: Self-pay | Admitting: Internal Medicine

## 2017-08-05 DIAGNOSIS — M109 Gout, unspecified: Secondary | ICD-10-CM

## 2017-08-06 ENCOUNTER — Encounter: Payer: Managed Care, Other (non HMO) | Admitting: Internal Medicine

## 2017-08-06 ENCOUNTER — Encounter: Payer: Self-pay | Admitting: Internal Medicine

## 2017-08-06 ENCOUNTER — Other Ambulatory Visit: Payer: Self-pay | Admitting: Internal Medicine

## 2017-08-06 MED ORDER — PREDNISONE 10 MG PO TABS
ORAL_TABLET | ORAL | 1 refills | Status: DC
Start: 2017-08-06 — End: 2017-08-31

## 2017-08-30 ENCOUNTER — Encounter: Payer: Self-pay | Admitting: Internal Medicine

## 2017-08-31 MED ORDER — FEBUXOSTAT 40 MG PO TABS: 40.0000 mg | ORAL_TABLET | Freq: Every day | ORAL | 3 refills | Status: AC

## 2017-08-31 MED ORDER — PREDNISONE 10 MG PO TABS: ORAL_TABLET | ORAL | 0 refills | Status: DC

## 2017-09-22 ENCOUNTER — Encounter: Payer: Managed Care, Other (non HMO) | Admitting: Internal Medicine

## 2017-10-18 ENCOUNTER — Encounter: Payer: Self-pay | Admitting: Internal Medicine

## 2017-10-18 MED ORDER — PREDNISONE 10 MG PO TABS: ORAL_TABLET | ORAL | 0 refills | Status: AC

## 2017-11-12 ENCOUNTER — Encounter: Payer: Self-pay | Admitting: Internal Medicine

## 2017-12-16 IMAGING — DX DG CHEST 2V
2 series · 2 of 2 positions shown · non-contrast
Comparison: None.

CLINICAL DATA: Weight loss over the past several months

EXAM:
CHEST  2 VIEW

[chest pa]
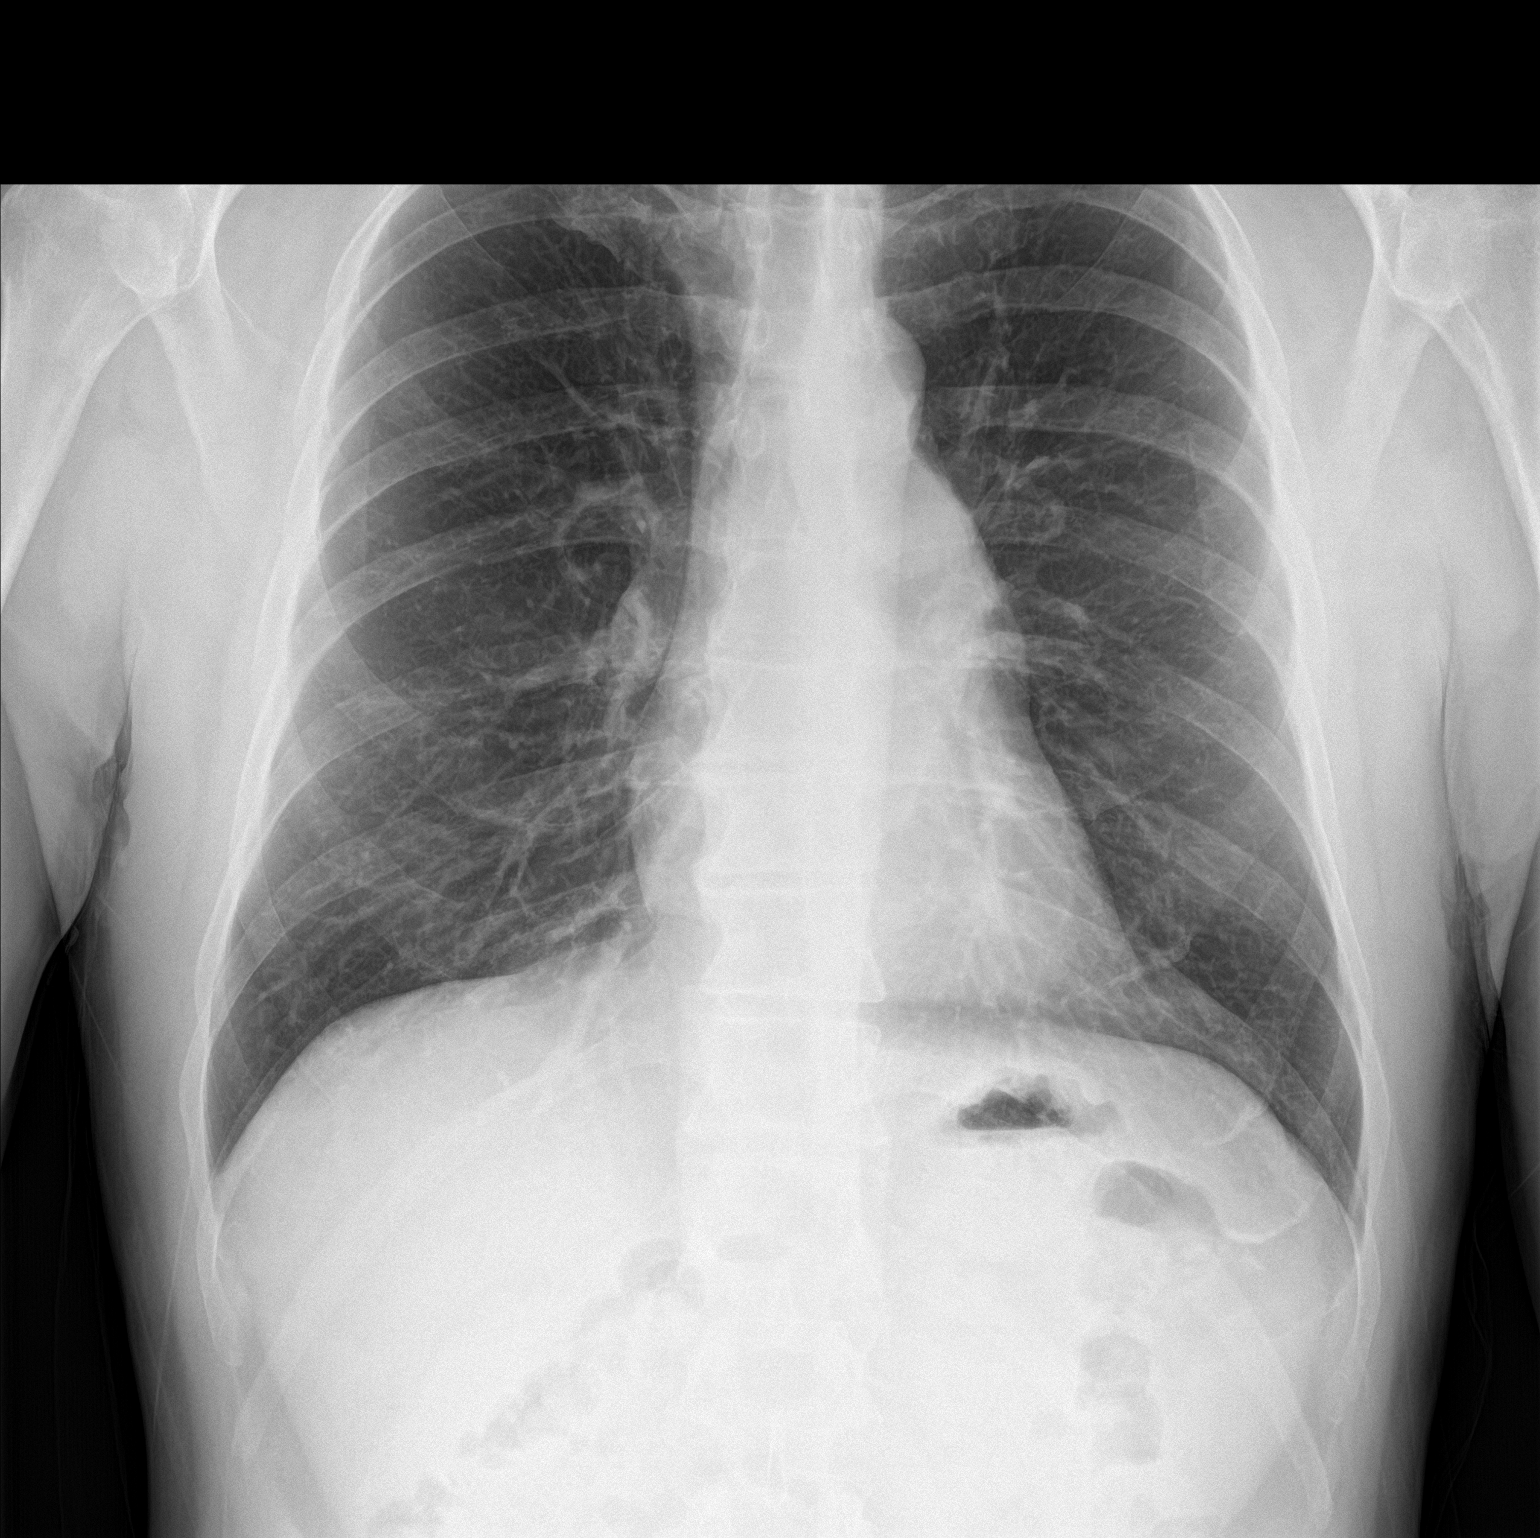

[chest lat]
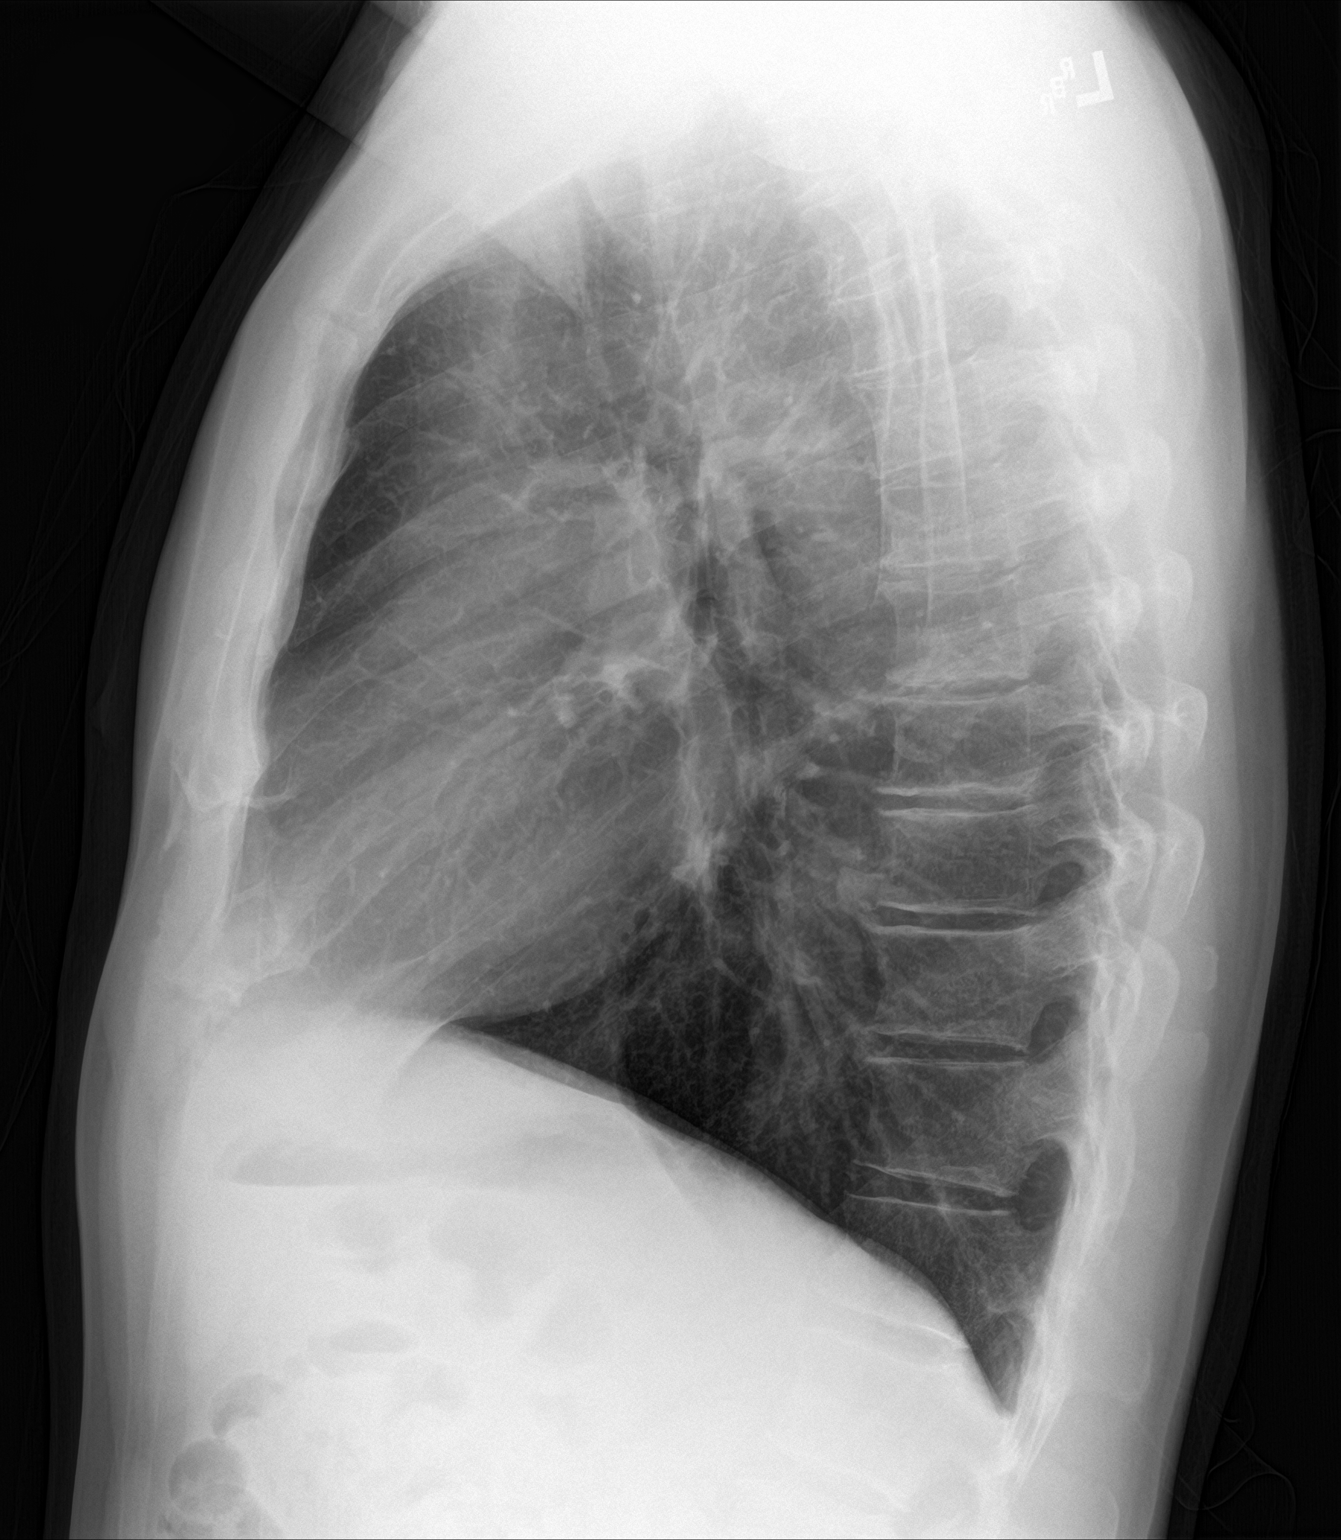

[2 of 2 positions shown; findings below may reference images not displayed]

FINDINGS: The heart size and mediastinal contours are within normal limits.
Both lungs are clear. The visualized skeletal structures are
unremarkable.
IMPRESSION: No active cardiopulmonary disease.

## 2018-01-20 ENCOUNTER — Encounter: Payer: Managed Care, Other (non HMO) | Admitting: Internal Medicine

## 2018-07-01 ENCOUNTER — Encounter: Payer: PRIVATE HEALTH INSURANCE | Attending: Urology | Primary: Family Medicine

## 2018-07-06 ENCOUNTER — Ambulatory Visit: Attending: Urology | Primary: Family Medicine

## 2018-07-06 ENCOUNTER — Ambulatory Visit
Admit: 2018-07-06 | Discharge: 2018-07-06 | Payer: PRIVATE HEALTH INSURANCE | Attending: Urology | Primary: Family Medicine

## 2018-07-06 DIAGNOSIS — R339 Retention of urine, unspecified: Secondary | ICD-10-CM

## 2018-07-06 MED ORDER — TAMSULOSIN SR 0.4 MG 24 HR CAP
0.4 mg | ORAL_CAPSULE | Freq: Every day | ORAL | 3 refills | Status: DC
Start: 2018-07-06 — End: 2019-02-01

## 2018-07-06 NOTE — Progress Notes (Signed)
Progress Notes by Karie Fetch, MD at 07/06/18 1445                Author: Karie Fetch, MD  Service: --  Author Type: Physician       Filed: 07/06/18 1547  Encounter Date: 07/06/2018  Status: Signed          Editor: Karie Fetch, MD (Physician)                       Telemedicine New Patient Visit   Justin Cruz   DOB [birth year]    Encounter Date: 07/06/2018          No diagnosis found.         Chief Complaint       Patient presents with        ?  Other             Atrophic Kidney. Pt wants to establish care.        ASSESSMENT:       1. Urinary Retention     RUS 06/13/2018 - post void residual of 513 cc      2. Bilateral Renal Cysts      3. Left Hydronephrosis     RUS 06/13/2018 - mild to moderate left hydronephrosis      4.  Incomplete bladder emptying      PLAN:     ??  Reviewed renal ultrasound findings with patient.   ??  I suspect he has poor detrusor function resulting in incomplete emptying as well as left hydronephrosis.   ??  Recommend starting Flomax for likely bladder outlet obstruction.    Rx Flomax 0.4 mg provided today - SE's reviewed.   Discussed mechanism of action, benefits, and adverse effects and alpha blockers including retrograde ejaculation, dizziness, nasal congestion, and fatigue.     ??  I also reviewed he will need to start CIC.  Due to current COVID pandemic will delay CIC minimum 6 weeks.   ??  Return to clinic in 6 to 8 weeks for repeat renal ultrasound and for CIC teaching.         HISTORY OF PRESENT ILLNESS:  Justin Cruz  is a 58 y.o. male who presents  for consultation as referred by Dr. Timoteo Expose, PA for urinary retention, renal cysts and hydronephrosis.       Referred by Dr. Barnetta Chapel, nephrologist with Stage III CKD for incomplete bladder emptying with left moderate hydronephrosis.    Underwent RUS. RUS from 06/13/2018 echogenic renal parenchyma bilaterally suggesting medical renal disease, mild to moderate left hydronephrosis, post void residual of 513 cc.        Fos weak to strong. Intermittent.    Nocturia x 2-4.    No hesitancy.    Urinary frequency ever 2 hours.    No UTIs.       CKD         LABS AND IMAGING:      PSA Trend   No results found for: PSA, Sherle Poe, PSAR3, [id number], [id number], PSALT      RUS 06/13/2018      FINDINGS:  ??  RIGHT KIDNEY: 11.0 cm in length. No solid renal mass. No visible calculi. Echogenic renal cortex. 2.4 cm lower pole cyst  ??  LEFT KIDNEY:  0.1 cm in length. No solid renal mass.. No visible calculi. Increased parenchymal echogenicity. Mild-to-moderate hydronephrosis. Small cyst  ??  BLADDER: Bilateral  ureteral jets are visualized. Prevoid bladder volume 591 cc. Postvoid residual  513 cc  ??  _______________  ??  IMPRESSION  ??  Large post void residual of 513 cc  Echogenic renal parenchyma bilaterally suggesting medical renal disease. Mild to moderate left hydronephrosis           Past Medical History:        Diagnosis  Date         ?  Hypertension           ?  Kidney disease          No past surgical history on file.     Social History          Tobacco Use         ?  Smoking status:  Never Smoker     ?  Smokeless tobacco:  Never Used       Substance Use Topics         ?  Alcohol use:  Yes              Frequency:  4 or more times a week         Drinks per session:  1 or 2         Binge frequency:  Never         ?  Drug use:  Not on file        No Known Allergies     Family History         Problem  Relation  Age of Onset          ?  Other  Mother                bladder cancer                 Review of Systems   Constitutional: Fever: No   Skin: Rash: No   HEENT: Hearing difficulty: No   Eyes: Blurred vision: No   Cardiovascular: Chest pain: No   Respiratory: Shortness of breath: No   Gastrointestinal: Nausea/vomiting: No   Musculoskeletal: Back pain: No   Neurological: Weakness: No   Psychological: Memory loss: No   Comments/additional findings:             PHYSICAL EXAMINATION:    Visit Vitals      Ht  6\' 1"  (1.854 m)      Wt  173 lb (78.5 kg)        BMI  22.82 kg/m??        Constitutional: Well developed, well-nourished in no acute distress.    Respiratory: No respiratory distress or difficulties       Skin:  Normal color. No evidence of jaundice.      Neuro/Psych:  Patient with appropriate affect.  Alert and oriented.              REVIEW OF LABS AND IMAGING:     No results found for this or any previous visit.                  A copy of today's office visit with all pertinent imaging results and labs were sent to the referring physician.     CC: Timoteo Expose, PA                This visit was conducted as a Firefighter rendered via real-time interactive audio and video telecommunications system. On June 22, 2018, the  World Health  Organization declared the COVID-19 (Novel Coronavirus) viral disease to be a pandemic. As a result of this emergency, a rapidly evolving situation, practice patterns for physicians, physician assistants, and nurse practitioners are shifting to accommodate  the need to treat in conjunction with unprecedented guidance from federal, state, and local authorities-- which include, but are not limited to, self-quarantines and/or limiting physical proximity to others under any number of circumstances.   It is within this context (and with the understanding that this method of patient encounter is in the patients best interest as well as the health and safety of other patients and the public) that telehealth is being provided  for this patient encounter rather than a face-to-face visit. This patient encounter is appropriate and reasonable under the circumstances given the patients particular presentation at this time. The patient has been advised of the potential risks  and limitations of this mode of treatment (including, but not limited to, the absence of in-person examination) and has agreed to be treated in a remote fashion in spite of them. Any and all of the patients/patients  familys questions  on this issue have been answered, and I have made no promises or guarantees to the patient. The patient has also been advised to contact this office for worsening conditions or problems, and seek emergency medical treatment and/or call 911 if the patient  deems either necessary.            Karie Fetch, M.D.   [facility]    [address]    [place], [place] [address]    Phone: [phone]     Fax: [phone]      [address] [place], [address]   [place], [place] [address]   P: [phone]    F: [phone]      Medical documentation provided with the assistance of Burnard Bunting, medical scribe to Karie Fetch, MD on  07/06/2018.

## 2018-07-06 NOTE — Progress Notes (Signed)
Telemedicine New Patient Visit  Justin Cruz  DOB 04-20-1960   Encounter Date: 07/06/2018       No diagnosis found.     Chief Complaint   Patient presents with   ??? Other     Atrophic Kidney. Pt wants to establish care.     ASSESSMENT:     1. Urinary Retention    RUS 06/13/2018 - post void residual of 513 cc    2. Bilateral Renal Cysts    3. Left Hydronephrosis    RUS 06/13/2018 - mild to moderate left hydronephrosis    4.  Incomplete bladder emptying    PLAN:    ?? Reviewed renal ultrasound findings with patient.  ?? I suspect he has poor detrusor function resulting in incomplete emptying as well as left hydronephrosis.  ?? Recommend starting Flomax for likely bladder outlet obstruction.   Rx Flomax 0.4 mg provided today - SE's reviewed.  Discussed mechanism of action, benefits, and adverse effects and alpha blockers including retrograde ejaculation, dizziness, nasal congestion, and fatigue.    ?? I also reviewed he will need to start CIC.  Due to current COVID pandemic will delay CIC minimum 6 weeks.  ?? Return to clinic in 6 to 8 weeks for repeat renal ultrasound and for CIC teaching.      HISTORY OF PRESENT ILLNESS:  Justin Cruz is a 58 y.o. male who presents for consultation as referred by Dr. Timoteo Expose, PA for urinary retention, renal cysts and hydronephrosis.     Referred by Dr. Barnetta Chapel, nephrologist with Stage III CKD for incomplete bladder emptying with left moderate hydronephrosis.   Underwent RUS. RUS from 06/13/2018 echogenic renal parenchyma bilaterally suggesting medical renal disease, mild to moderate left hydronephrosis, post void residual of 513 cc.     Fos weak to strong. Intermittent.   Nocturia x 2-4.   No hesitancy.   Urinary frequency ever 2 hours.   No UTIs.     CKD      LABS AND IMAGING:    PSA Trend  No results found for: PSA, Sherle Poe, PSAR3, [id number], [id number], PSALT    RUS 06/13/2018    FINDINGS:  ??   RIGHT KIDNEY: 11.0 cm in length. No solid renal mass. No visible calculi. Echogenic renal cortex. 2.4 cm lower pole cyst  ??  LEFT KIDNEY: 0.1 cm in length. No solid renal mass.. No visible calculi. Increased parenchymal echogenicity. Mild-to-moderate hydronephrosis. Small cyst  ??  BLADDER: Bilateral ureteral jets are visualized. Prevoid bladder volume 591 cc. Postvoid residual 513 cc  ??  _______________  ??  IMPRESSION  ??  Large post void residual of 513 cc  Echogenic renal parenchyma bilaterally suggesting medical renal disease. Mild to moderate left hydronephrosis      Past Medical History:   Diagnosis Date   ??? Hypertension    ??? Kidney disease      No past surgical history on file.  Social History     Tobacco Use   ??? Smoking status: Never Smoker   ??? Smokeless tobacco: Never Used   Substance Use Topics   ??? Alcohol use: Yes     Frequency: 4 or more times a week     Drinks per session: 1 or 2     Binge frequency: Never   ??? Drug use: Not on file     No Known Allergies  Family History   Problem Relation Age of Onset   ??? Other  Mother         bladder cancer           Review of Systems  Constitutional: Fever: No  Skin: Rash: No  HEENT: Hearing difficulty: No  Eyes: Blurred vision: No  Cardiovascular: Chest pain: No  Respiratory: Shortness of breath: No  Gastrointestinal: Nausea/vomiting: No  Musculoskeletal: Back pain: No  Neurological: Weakness: No  Psychological: Memory loss: No  Comments/additional findings:         PHYSICAL EXAMINATION:   Visit Vitals  Ht 6\' 1"  (1.854 m)   Wt 173 lb (78.5 kg)   BMI 22.82 kg/m??     Constitutional: Well developed, well-nourished in no acute distress.   Respiratory: No respiratory distress or difficulties     Skin:  Normal color. No evidence of jaundice.     Neuro/Psych:  Patient with appropriate affect.  Alert and oriented.          REVIEW OF LABS AND IMAGING:    No results found for this or any previous visit.             A copy of today's office visit with all pertinent imaging results and labs were sent to the referring physician.    CC: Timoteo Expose, PA           This visit was conducted as a Firefighter rendered via real-time interactive audio and video telecommunications system. On June 22, 2018, the World Health Organization declared the COVID-19 (Novel Coronavirus) viral disease to be a pandemic. As a result of this emergency, a rapidly evolving situation, practice patterns for physicians, physician assistants, and nurse practitioners are shifting to accommodate the need to treat in conjunction with unprecedented guidance from federal, state, and local authorities??? which include, but are not limited to, self-quarantines and/or limiting physical proximity to others under any number of circumstances.  It is within this context (and with the understanding that this method of patient encounter is in the patient???s best interest as well as the health and safety of other patients and the public) that ???telehealth??? is being provided for this patient encounter rather than a face-to-face visit. This patient encounter is appropriate and reasonable under the circumstances given the patient???s particular presentation at this time. The patient has been advised of the potential risks and limitations of this mode of treatment (including, but not limited to, the absence of in-person examination) and has agreed to be treated in a remote fashion in spite of them. Any and all of the patient???s/patient???s family???s questions on this issue have been answered, and I have made no promises or guarantees to the patient. The patient has also been advised to contact this office for worsening conditions or problems, and seek emergency medical treatment and/or call 911 if the patient deems either necessary.        Karie Fetch, M.D.  [facility]   [address]   [place], [place] [address]   Phone: [phone]     Fax: [phone]    [address] [place], [address]  [place], [place] [address]  P: [phone]   F: [phone]    Medical documentation provided with the assistance of Burnard Bunting, medical scribe to Karie Fetch, MD on 07/06/2018.

## 2018-08-23 ENCOUNTER — Encounter: Payer: PRIVATE HEALTH INSURANCE | Primary: Family Medicine

## 2018-08-24 ENCOUNTER — Encounter: Payer: PRIVATE HEALTH INSURANCE | Attending: Nurse Practitioner | Primary: Family Medicine

## 2018-08-30 ENCOUNTER — Encounter: Payer: PRIVATE HEALTH INSURANCE | Primary: Family Medicine

## 2018-12-23 ENCOUNTER — Inpatient Hospital Stay: Admit: 2018-12-23 | Payer: BLUE CROSS/BLUE SHIELD | Primary: Family Medicine

## 2018-12-23 DIAGNOSIS — Z01818 Encounter for other preprocedural examination: Secondary | ICD-10-CM

## 2018-12-23 LAB — EKG, 12 LEAD, INITIAL
Atrial Rate: 53 {beats}/min
Calculated P Axis: 76 degrees
Calculated R Axis: 70 degrees
Calculated T Axis: 67 degrees
P-R Interval: 192 ms
Q-T Interval: 414 ms
QRS Duration: 92 ms
QTC Calculation (Bezet): 388 ms
Ventricular Rate: 53 {beats}/min

## 2018-12-23 LAB — HGB & HCT
HCT: 39.1 % (ref 36.0–48.0)
HGB: 13 g/dL (ref 13.0–16.0)

## 2018-12-23 LAB — METABOLIC PANEL, BASIC
Anion gap: 1 mmol/L — ABNORMAL LOW (ref 3.0–18)
BUN/Creatinine ratio: 10 — ABNORMAL LOW (ref 12–20)
BUN: 21 MG/DL — ABNORMAL HIGH (ref 7.0–18)
CO2: 31 mmol/L (ref 21–32)
Calcium: 9.3 MG/DL (ref 8.5–10.1)
Chloride: 106 mmol/L (ref 100–111)
Creatinine: 2.08 MG/DL — ABNORMAL HIGH (ref 0.6–1.3)
GFR est AA: 40 mL/min/{1.73_m2} — ABNORMAL LOW (ref >60)
GFR est non-AA: 33 mL/min/{1.73_m2} — ABNORMAL LOW (ref >60)
Glucose: 86 mg/dL (ref 74–99)
Potassium: 4.9 mmol/L (ref 3.5–5.5)
Sodium: 138 mmol/L (ref 136–145)

## 2018-12-23 LAB — EKG 12-LEAD
Atrial Rate: 53 {beats}/min
P Axis: 76 degrees
P-R Interval: 192 ms
Q-T Interval: 414 ms
QRS Duration: 92 ms
QTc Calculation (Bazett): 388 ms
R Axis: 70 degrees
T Axis: 67 degrees
Ventricular Rate: 53 {beats}/min

## 2018-12-23 LAB — BASIC METABOLIC PANEL
Anion Gap: 1 mmol/L — ABNORMAL LOW (ref 3.0–18)
BUN: 21 MG/DL — ABNORMAL HIGH (ref 7.0–18)
Bun/Cre Ratio: 10 — ABNORMAL LOW (ref 12–20)
CO2: 31 mmol/L (ref 21–32)
Calcium: 9.3 MG/DL (ref 8.5–10.1)
Chloride: 106 mmol/L (ref 100–111)
Creatinine: 2.08 MG/DL — ABNORMAL HIGH (ref 0.6–1.3)
EGFR IF NonAfrican American: 33 mL/min/{1.73_m2} — ABNORMAL LOW (ref >60)
GFR African American: 40 mL/min/{1.73_m2} — ABNORMAL LOW (ref >60)
Glucose: 86 mg/dL (ref 74–99)
Potassium: 4.9 mmol/L (ref 3.5–5.5)
Sodium: 138 mmol/L (ref 136–145)

## 2018-12-23 LAB — HEMOGLOBIN AND HEMATOCRIT
Hematocrit: 39.1 % (ref 36.0–48.0)
Hemoglobin: 13 g/dL (ref 13.0–16.0)

## 2018-12-23 NOTE — Interval H&P Note (Signed)
No sleep apnea, removable prosthetic devices or family history of malignant hyperthermia.  PCP is not aware of the surgery. No participation in clinical trial or research study. Do not bring any valuables on DOS- jewelry, wallet, cash, laptop, medications, cell phone and have companion remain on [facility] property until checked in by pre op. CDC guidelines reviewed.  Does not meet criteria for special population at this time. Pt instructed to contact MD regarding anticoagulation medication management prior to surgery- aspirin.

## 2018-12-23 NOTE — Interval H&P Note (Signed)
BMP sent to Dr. Kennieth Francois office with instructions to send to PCP per anesthesia.

## 2018-12-23 NOTE — Other (Signed)
BMP sent to Dr. Darby's office with instructions to send to PCP per anesthesia.

## 2018-12-24 ENCOUNTER — Inpatient Hospital Stay: Payer: BLUE CROSS/BLUE SHIELD | Primary: Family Medicine

## 2018-12-24 LAB — NOVEL CORONAVIRUS (COVID-19): SARS-CoV-2: NOT DETECTED

## 2018-12-24 LAB — COVID-19: SARS-CoV-2: NOT DETECTED

## 2018-12-27 NOTE — H&P (Signed)
H&P by Sunday Spillersarby, Alessander Sikorski, MD at 12/27/18  [phone]                Author: Sunday Spillersarby, Shardae Kleinman, MD  Service: Urology  Author Type: Physician       Filed: 12/27/18 0644  Date of Service: 12/27/18 0644  Status: Signed          Editor: Sunday Spillersarby, Shuntia Exton, MD (Physician)                    [facility]   [address]   [place] [id number]   Tel: [phone]   Fax: [phone]         Patient: Justin ErbJohn Cruz   Date of Birth: [birth year]   Date: 12/23/2018 8:30 AM    Visit Type: Office Visit            Assessment/Plan       #  Detail Type  Description          1.  Assessment  BPH w urinary obs/LUTS (N40.1).           Patient Plan   Pt with worsening urinary problems.  On US his prostate was under 75gm, Cystoscopy revealed enlarged lateral lobes but not median lobe.  His  uroflow study revealed evidence of obstruction.  I review options with him and he wants to proceed with Urolift.  All risks including pain infection bleeding possible need for additonal procedure in the future were reviewed.  He understands and wants  to proceed           Plan Orders  Hematocrit to be performed. and Hemoglobin to be performed.                       2.  Assessment  Feeling of incomplete bladder emptying (R39.14).           Patient Plan                         3.  Assessment  Poor urinary stream (R39.12).                       4.  Other Orders  Orders not associated to today's assessments.           Plan Orders  Further diagnostic evaluations ordered today include(s) ELECTROCARDIOGRAM, COMPLETE to be performed.                     This 58 year old male presents for  BPH, Renal Mass and Hydronephrosis.   History of Present Illness:   1.  BPH    Onset was gradual. Severity level is moderate. It occurs daily. The problem is worse. Reviewed today was a PSA taken on 11/22/2018 with  findings of 2.80 ng/mL. Associated symptoms include incomplete emptying, nocturia (2 times per night), slow stream and urinary frequency. Pertinent negatives  include chills, constipation, fever, hematuria, urgency and urinary incontinence. Additional  information: Patient by symptomatology appears have some mild BPH issues.  However he has an elevated postvoid residual on a recent ultrasound and today his postvoid residual was close to 400 mL.  He was given Flomax no change but had alot of side effects.   It sounded like he had orthostatic hypotension which was causing him some dizziness and making very tired.  08/10/2018   Pt ns is working better ow on Alfuzosin 10mg , he feels that it is working better.  He feels his bladder is voiding   11/30/2018   Patient is here today.  He continues to void better using alfuzosin however he has a very high postvoid residual still at 422 mL discussed options I am going to set him up for a cystoscopy and uroflow study to get a better idea of his bladder function.   12/17/2018   Patient is here today to undergo a cystoscopy and uroflow study to better evaluate his urinary symptoms.   12/23/2018    patient here today for a prostate ultrasound.  The he wants to undergo a UroLift however he needs to have a prostate ultrasound to prove that his prostate is under 80 g before it is approved.   2.  Renal Mass    Onset was sudden.  Pain level is mild.  Multiple masses are present.  Quality of the mass: incidental cyst.  There are no identifying  factors.  No treatment has been performed.  Associated symptoms include frequency (urinary).  Pertinent negatives include fever, headache, hematuria, nausea, urgency and vomiting.  Additional information: Patient with incidental cysts of the kidney bilaterally.   These were seen on ultrasound.  I am obtaining a CT scan of the abdomen pelvis with without contrast that should hopefully allow [place] to get more information on the cysts.             3.  Hydronephrosis    Severity level is severe. Location of pain is None. The pain does not radiate. Prior stone type of unknown. There are no aggravating   factors. The patient lists no relieving factors. Associated symptoms include urinary frequency. Pertinent negatives include chills, constipation, diarrhea, fever, hematuria, nausea and vomiting.             Pt has a hx of having an atrophic left  kidney.  This was noted when he went for life insurance in his 50s.  He also had a recent renal ultrasound revealing mild-to-moderate hydronephrosis of the left kidney as well as bilateral renal cysts and a high postvoid residual.  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  I am going to obtain a CT scan the abdomen pelvis to better evaluate patient''''''''''''''''s renal anatomy   08/10/2018   Patient is here today for follow-up he underwent a CT scan to better evaluate.  The left hydronephrosis and renal cysts.  The CT showed no evidence of hydronephrosis, left greater than right-sided renal atrophy no evidence of cyst punctate nonobstructing  stones left kidney moderately distended bladder no other abnormalities noted.  At this time no further imaging seems to be needed.               PAST MEDICAL/SURGICAL HISTORY   (Reviewed, updated)            Disease/disorder  Onset Date  Management  Date  Comments           Hypertension                   kidney disease                      PROBLEM LIST:   Problem List reviewed.             Medications (active prior to today)          Medication Name  Sig Description  Start Date  Stop Date  Refilled  Rx Elsewhere            Aspir-81 mg tablet,delayed release  take 1 tablet by oral route  every day  //      Y            Uloric 40 mg tablet  take 1 tablet by oral route  every day  //      Y            telmisartan 20 mg tablet  take 1 tablet by oral route  every day  //      Y            multivitamin capsule  take 1 capsule by oral route  every day  //      Y            alfuzosin ER 10 mg tablet,extended release 24 hr  take 1 tablet by oral route  every day  07/26/2018      N        Medication Reconciliation   Medications reconciled today.    Medication Reviewed         Adherence  Medication Name  Sig Desc  Elsewhere  Status           taking as directed  Aspir-81 mg tablet,delayed release  take 1 tablet by oral route  every day  Y  Verified           taking as directed  telmisartan 20 mg tablet  take 1 tablet by oral route  every day  Y  Verified           taking as directed  Uloric 40 mg tablet  take 1 tablet by oral route  every day  Y  Verified           taking as directed  multivitamin capsule  take 1 capsule by oral route  every day  Y  Verified           taking as directed  alfuzosin ER 10 mg tablet,extended release 24 hr  take 1 tablet by oral route  every day  N  Verified        Allergies        Ingredient  Reaction (Severity)  Medication Name  Comment          NO KNOWN ALLERGIES              Reviewed, no changes.   Family History   (Reviewed, updated)           Relationship  Family Member Name  Deceased  Age at Death  Condition  Onset Age  Cause of Death             Father        Myocardial infarction                 Mother        Cancer                  Social History:  (Reviewed, updated)   Tobacco use reviewed.   Preferred language is [place].     MARITAL STATUS/FAMILY/SOCIAL SUPPORT     Marital status: Married     Tobacco use status: Current non-smoker.   Smoking status: Never smoker.      TOBACCO SCREENING:   Patient has never used tobacco. Patient has not used tobacco in the  last 30 days. Patient has not used smokeless tobacco in the last 30 days.      SMOKING STATUS          Type  Smoking Status  Usage Per Day  Years Used  Pack Years  Total Pack Years              Never smoker                ALCOHOL   There is a history of alcohol use.    Type: Beer. 2 beers consumed daily.   CAFFEINE   The patient does not use caffeine.                           Review of Systems       System  Neg/Pos  Details         Constitutional  Negative  Chills and Fever.         ENMT  Negative  Ear infections and Sore throat.         Eyes  Negative  Blurred  vision, Double vision and Eye pain.         Respiratory  Negative  Asthma, Chronic cough, Dyspnea and Wheezing.         Cardio  Negative  Chest pain.         GI  Negative  Constipation, Decreased appetite, Diarrhea, Nausea and Vomiting.         GU  Positive  Incomplete emptying, Nocturia, Slow stream, Urinary frequency.         GU  Negative  Hematuria, Urgency and Urinary incontinence.         Endocrine  Negative  Cold intolerance, Heat intolerance, Increased thirst and Weight loss.         Neuro  Negative  Headache and Tremors.         Psych  Negative  Anxiety and Depression.         Integumentary  Negative  Itching skin and Rash.         MS  Negative  Back pain and Joint pain.         Hema/Lymph  Negative  Easy bleeding.         Reproductive  Negative  Sexual dysfunction.          Vital Signs     Height          Time  ft  in  cm  Last Measured  Height Position            8:34 AM  6.0  1.00  185.42  07/26/2018  0     Weight/BSA/BMI           Time  lb  oz  kg  Context  BMI kg/m2  BSA m2             8:34 AM  172.00    78.018    22.69       Measured By      Time  Measured by        8:34 AM  Leonard SchwartzLeah Perry        Physical Exam       Exam  Findings  Details         Constitutional  Normal  Well developed.     Neck Exam  Normal  Inspection - Normal.     Respiratory  Normal  Inspection - Normal.  Extremity  Normal  No edema.     Neurological  Normal  Alert and oriented to person, place and time.  Cranial nerves intact.  No motor or sensory deficits.         Psychiatric  Normal  Orientation - Oriented to time, place, person & situation. Appropriate mood and affect.              Procedures:         Prostate Ultrasound/Biopsy:   Indication:   Enlarged prostate w/ LUTS N40.1.   Patient Consent:   Consent was obtained. The procedure and risks were explained in detail. Questions were encouraged and answered. Patient prepped and draped in the usual clean fashion.   Digital Rectal Exam:    Palpable normal prostate.   Ultrasound  Findings:   Prostate:    The volume is 25.00 mL.   Capsule:    Normal in appearance.    Anterior Fibromuscular Stroma:    Normal in appearance.    Seminal Vesicles:    Normal in appearance. .   No lesions were noted.     Patient Response:    The patient tolerated the procedure well with no complications.         Patient Education      #  Patient Education        1.  Benign Prostatic Hyperplasia: Care In~        Medications (added, continued, or stopped today)           Start Date  Medication  Directions  PRN Status  PRN Reason  Instruction  Stop Date             07/26/2018  alfuzosin ER 10 mg tablet,extended release 24 hr  take 1 tablet by oral route  every day  N             Aspir-81 mg tablet,delayed release  take 1 tablet by oral route  every day  N             multivitamin capsule  take 1 capsule by oral route  every day  N             telmisartan 20 mg tablet  take 1 tablet by oral route  every day  N                     Uloric 40 mg tablet  take 1 tablet by oral route  every day  N           Active Patient Care Team Members               Name  Contact  Agency Type  Support Role  Relationship  Active Date  Inactive Date  Specialty              Almyra Deforest      Emergency Contact  Spouse                    Maxcine Ham      Patient provider  PCP                 Provider:  Sunday Spillers, MD 12/23/2018 08:30 AM   Document generated by:   Stevphen Meuse. Nnamdi Dacus  12/23/2018 02:30 PM            Electronically signed by Sunday Spillers MD on 12/24/2018 12:35 PM

## 2018-12-27 NOTE — Other (Signed)
No sleep apnea, removable prosthetic devices or family history of malignant hyperthermia.  PCP is not aware of the surgery. No participation in clinical trial or research study. Do not bring any valuables on DOS- jewelry, wallet, cash, laptop, medications, cell phone and have companion remain on [facility] property until checked in by pre op. CDC guidelines reviewed.  Does not meet criteria for special population at this time. Pt instructed to contact MD regarding anticoagulation medication management prior to surgery- aspirin.

## 2018-12-27 NOTE — H&P (Signed)
[facility]  [address]  [place] [address]  Tel: [phone]  Fax: [phone]      Patient: Justin Cruz  Date of Birth: 1960/10/28  Date: 12/23/2018 8:30 AM   Visit Type: Office Visit        Assessment/Plan  # Detail Type Description    1. 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    Patient Plan  Pt with worsening urinary problems.  On [place] his prostate was under 75gm, Cystoscopy revealed enlarged lateral lobes but not median lobe.  His uroflow study revealed evidence of obstruction.  I review options with him and he wants to proceed with Urolift.  All risks including pain infection bleeding possible need for additonal procedure in the future were reviewed.  He understands and wants to proceed    Plan Orders Hematocrit to be performed. and Hemoglobin to be performed.         2. Assessment Feeling of incomplete bladder emptying (R39.14).    Patient Plan          3. Assessment Poor urinary stream (R39.12).         4. Other Orders Orders not associated to today's assessments.    Plan Orders Further diagnostic evaluations ordered today include(s) ELECTROCARDIOGRAM, COMPLETE to be performed.              This 58 year old male presents for BPH, Renal Mass and Hydronephrosis.  History of Present Illness:  1.  BPH   Onset was gradual. Severity level is moderate. It occurs daily. The problem is worse. Reviewed today was a PSA taken on 11/22/2018 with findings of 2.80 ng/mL. Associated symptoms include incomplete emptying, nocturia (2 times per night), slow stream and urinary frequency. Pertinent negatives include chills, constipation, fever, hematuria, urgency and urinary incontinence. Additional information: Patient by symptomatology appears have some mild BPH issues.  However he has an elevated postvoid residual on a recent ultrasound and today his postvoid residual was close to 400 mL.  He was given Flomax no change but had alot of side effects.   It sounded like he had orthostatic hypotension which was causing him some dizziness and making very tired.      08/10/2018  Pt ns is working better ow on Alfuzosin 10mg , he feels that it is working better.  He feels his bladder is voiding  11/30/2018  Patient is here today.  He continues to void better using alfuzosin however he has a very high postvoid residual still at 422 mL discussed options I am going to set him up for a cystoscopy and uroflow study to get a better idea of his bladder function.  12/17/2018  Patient is here today to undergo a cystoscopy and uroflow study to better evaluate his urinary symptoms.  12/23/2018   patient here today for a prostate ultrasound.  The he wants to undergo a UroLift however he needs to have a prostate ultrasound to prove that his prostate is under 80 g before it is approved.  2.  Renal Mass   Onset was sudden.  Pain level is mild.  Multiple masses are present.  Quality of the mass: incidental cyst.  There are no identifying factors.  No treatment has been performed.  Associated symptoms include frequency (urinary).  Pertinent negatives include fever, headache, hematuria, nausea, urgency and vomiting.  Additional information: Patient with incidental cysts of the kidney bilaterally.  These were seen on ultrasound.  I am obtaining a CT scan of the  abdomen pelvis with without contrast that should hopefully allow [place] to get more information on the cysts.      3.  Hydronephrosis   Severity level is severe. Location of pain is None. The pain does not radiate. Prior stone type of unknown. There are no aggravating factors. The patient lists no relieving factors. Associated symptoms include urinary frequency. Pertinent negatives include chills, constipation, diarrhea, fever, hematuria, nausea and vomiting.      Pt has a hx of having an atrophic left kidney.  This was noted when he went for life insurance in his 5s.  He also had a recent renal ultrasound  revealing mild-to-moderate hydronephrosis of the left kidney as well as bilateral renal cysts and a high postvoid residual.  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  I am going to obtain a CT scan the abdomen pelvis to better evaluate patient''''''''''''''''s renal anatomy  08/10/2018  Patient is here today for follow-up he underwent a CT scan to better evaluate.  The left hydronephrosis and renal cysts.  The CT showed no evidence of hydronephrosis, left greater than right-sided renal atrophy no evidence of cyst punctate nonobstructing stones left kidney moderately distended bladder no other abnormalities noted.  At this time no further imaging seems to be needed.          PAST MEDICAL/SURGICAL HISTORY   (Reviewed, updated)    Disease/disorder Onset Date Management Date Comments   Hypertension       kidney disease             PROBLEM LIST:   Problem List reviewed.         Medications (active prior to today)  Medication Name Sig Description Start Date Stop Date Refilled Rx Elsewhere   Aspir-81 mg tablet,delayed release take 1 tablet by oral route  every day //   Y   Uloric 40 mg tablet take 1 tablet by oral route  every day //   Y   telmisartan 20 mg tablet take 1 tablet by oral route  every day //   Y   multivitamin capsule take 1 capsule by oral route  every day //   Y   alfuzosin ER 10 mg tablet,extended release 24 hr take 1 tablet by oral route  every day 07/26/2018   N     Medication Reconciliation  Medications reconciled today.  Medication Reviewed  Adherence Medication Name Sig Desc Elsewhere Status   taking as directed Aspir-81 mg tablet,delayed release take 1 tablet by oral route  every day Y Verified   taking as directed telmisartan 20 mg tablet take 1 tablet by oral route  every day Y Verified   taking as directed Uloric 40 mg tablet take 1 tablet by oral route  every day Y Verified   taking as directed multivitamin capsule take 1 capsule by oral route  every day Y Verified    taking as directed alfuzosin ER 10 mg tablet,extended release 24 hr take 1 tablet by oral route  every day N Verified     Allergies  Ingredient Reaction (Severity) Medication Name Comment   NO KNOWN ALLERGIES        Reviewed, no changes.  Family History  (Reviewed, updated)  Relationship Family Member Name Deceased Age at Death Condition Onset Age Cause of Death   Father    Myocardial infarction     Mother    Cancer           Social History:  (Reviewed,  updated)  Tobacco use reviewed.  Preferred language is [place].    MARITAL STATUS/FAMILY/SOCIAL SUPPORT  Marital status: Married   Tobacco use status: Current non-smoker.  Smoking status: Never smoker.    TOBACCO SCREENING:  Patient has never used tobacco. Patient has not used tobacco in the last 30 days. Patient has not used smokeless tobacco in the last 30 days.    SMOKING STATUS  Type Smoking Status Usage Per Day Years Used Pack Years Total Pack Years    Never smoker         ALCOHOL  There is a history of alcohol use.   Type: Beer. 2 beers consumed daily.  CAFFEINE  The patient does not use caffeine.                  Review of Systems  System Neg/Pos Details   Constitutional Negative Chills and Fever.   ENMT Negative Ear infections and Sore throat.   Eyes Negative Blurred vision, Double vision and Eye pain.   Respiratory Negative Asthma, Chronic cough, Dyspnea and Wheezing.   Cardio Negative Chest pain.   GI Negative Constipation, Decreased appetite, Diarrhea, Nausea and Vomiting.   GU Positive Incomplete emptying, Nocturia, Slow stream, Urinary frequency.   GU Negative Hematuria, Urgency and Urinary incontinence.   Endocrine Negative Cold intolerance, Heat intolerance, Increased thirst and Weight loss.   Neuro Negative Headache and Tremors.   Psych Negative Anxiety and Depression.   Integumentary Negative Itching skin and Rash.   MS Negative Back pain and Joint pain.   Hema/Lymph Negative Easy bleeding.   Reproductive Negative Sexual dysfunction.      Vital Signs   Height  Time ft in cm Last Measured Height Position   8:34 AM 6.0 1.00 185.42 07/26/2018 0   Weight/BSA/BMI  Time lb oz kg Context BMI kg/m2 BSA m2   8:34 AM 172.00  78.018  22.69    Measured By  Time Measured by   8:34 AM Leonard SchwartzLeah Perry     Physical Exam  Exam Findings Details   Constitutional Normal Well developed.   Neck Exam Normal Inspection - Normal.   Respiratory Normal Inspection - Normal.   Extremity Normal No edema.   Neurological Normal Alert and oriented to person, place and time.  Cranial nerves intact.  No motor or sensory deficits.   Psychiatric Normal Orientation - Oriented to time, place, person & situation. Appropriate mood and affect.         Procedures:      Prostate Ultrasound/Biopsy:  Indication:  Enlarged prostate w/ LUTS N40.1.  Patient Consent:  Consent was obtained. The procedure and risks were explained in detail. Questions were encouraged and answered. Patient prepped and draped in the usual clean fashion.  Digital Rectal Exam:   Palpable normal prostate.  Ultrasound Findings:  Prostate:   The volume is 25.00 mL.  Capsule:   Normal in appearance.   Anterior Fibromuscular Stroma:   Normal in appearance.   Seminal Vesicles:   Normal in appearance. .  No lesions were noted.    Patient Response:   The patient tolerated the procedure well with no complications.      Patient Education  # Patient Education   1. Benign Prostatic Hyperplasia: Care In~     Medications (added, continued, or stopped today)  Start Date Medication Directions PRN Status PRN Reason Instruction Stop Date   07/26/2018 alfuzosin ER 10 mg tablet,extended release 24 hr take 1 tablet by oral route  every day  N       Aspir-81 mg tablet,delayed release take 1 tablet by oral route  every day N       multivitamin capsule take 1 capsule by oral route  every day N       telmisartan 20 mg tablet take 1 tablet by oral route  every day N       Uloric 40 mg tablet take 1 tablet by oral route  every day N       Active Patient Care Team Members    Name Contact Agency Type Support Role Relationship Active Date Inactive Date Specialty   Almyra DeforestJennifer Gaus   Emergency Contact Spouse      Maxcine HamStefan Brecke   Patient provider PCP          Provider: Sunday SpillersEric  Zi Sek, MD 12/23/2018 08:30 AM  Document generated by:  Stevphen MeuseEric C. Kemoni Ortega 12/23/2018 02:30 PM        Electronically signed by Sunday SpillersEric Rickell Wiehe MD on 12/24/2018 12:35 PM

## 2018-12-27 NOTE — Other (Signed)
Attempted to contact for PAT interview. No answer, message left.

## 2018-12-29 ENCOUNTER — Inpatient Hospital Stay: Payer: BLUE CROSS/BLUE SHIELD

## 2018-12-29 MED ORDER — ONDANSETRON (PF) 4 MG/2 ML INJECTION
4 mg/2 mL | INTRAMUSCULAR | Status: DC | PRN
Start: 2018-12-29 — End: 2018-12-29
  Administered 2018-12-29: 19:00:00 via INTRAVENOUS

## 2018-12-29 MED ORDER — PROPOFOL 10 MG/ML IV EMUL
10 mg/mL | INTRAVENOUS | Status: DC | PRN
Start: 2018-12-29 — End: 2018-12-29
  Administered 2018-12-29: 19:00:00 via INTRAVENOUS

## 2018-12-29 MED ORDER — FUROSEMIDE 10 MG/ML IJ SOLN
10 mg/mL | INTRAMUSCULAR | Status: DC | PRN
Start: 2018-12-29 — End: 2018-12-29
  Administered 2018-12-29: 19:00:00 via INTRAVENOUS

## 2018-12-29 MED ORDER — LIDOCAINE (PF) 20 MG/ML (2 %) IJ SOLN
20 mg/mL (2 %) | INTRAMUSCULAR | Status: AC
Start: 2018-12-29 — End: ?

## 2018-12-29 MED ORDER — FUROSEMIDE 10 MG/ML IJ SOLN
10 mg/mL | INTRAMUSCULAR | Status: AC
Start: 2018-12-29 — End: ?

## 2018-12-29 MED ORDER — LACTATED RINGERS IV
INTRAVENOUS | Status: DC
Start: 2018-12-29 — End: 2018-12-29
  Administered 2018-12-29 (×3): via INTRAVENOUS

## 2018-12-29 MED ORDER — LIDOCAINE (PF) 20 MG/ML (2 %) IJ SOLN
20 mg/mL (2 %) | INTRAMUSCULAR | Status: DC | PRN
Start: 2018-12-29 — End: 2018-12-29
  Administered 2018-12-29: 19:00:00 via INTRAVENOUS

## 2018-12-29 MED ORDER — MIDAZOLAM (PF) 1 MG/ML INJECTION SOLUTION
1 mg/mL | INTRAMUSCULAR | Status: AC
Start: 2018-12-29 — End: ?

## 2018-12-29 MED ORDER — BELLADONNA ALKALOIDS-OPIUM 16.2 MG-30 MG RECTAL SUPPOSITORY
RECTAL | Status: DC | PRN
Start: 2018-12-29 — End: 2018-12-29
  Administered 2018-12-29: 19:00:00 via RECTAL

## 2018-12-29 MED ORDER — PROPOFOL 10 MG/ML IV EMUL
10 mg/mL | INTRAVENOUS | Status: AC
Start: 2018-12-29 — End: ?

## 2018-12-29 MED ORDER — FENTANYL CITRATE (PF) 50 MCG/ML IJ SOLN
50 mcg/mL | INTRAMUSCULAR | Status: DC | PRN
Start: 2018-12-29 — End: 2018-12-29
  Administered 2018-12-29 (×4): via INTRAVENOUS

## 2018-12-29 MED ORDER — LEVOFLOXACIN IN D5W 500 MG/100 ML IV PIGGY BACK
500 mg/100 mL | Freq: Once | INTRAVENOUS | Status: AC
Start: 2018-12-29 — End: 2018-12-29
  Administered 2018-12-29: 19:00:00 via INTRAVENOUS

## 2018-12-29 MED ORDER — BELLADONNA ALKALOIDS-OPIUM 16.2 MG-30 MG RECTAL SUPPOSITORY
RECTAL | Status: AC
Start: 2018-12-29 — End: ?

## 2018-12-29 MED ORDER — FENTANYL CITRATE (PF) 50 MCG/ML IJ SOLN
50 mcg/mL | INTRAMUSCULAR | Status: AC
Start: 2018-12-29 — End: ?

## 2018-12-29 MED ORDER — MIDAZOLAM 1 MG/ML IJ SOLN
1 mg/mL | INTRAMUSCULAR | Status: DC | PRN
Start: 2018-12-29 — End: 2018-12-29
  Administered 2018-12-29: 19:00:00 via INTRAVENOUS

## 2018-12-29 MED FILL — LEVOFLOXACIN IN D5W 500 MG/100 ML IV PIGGY BACK: 500 mg/100 mL | INTRAVENOUS | Qty: 100

## 2018-12-29 MED FILL — XYLOCAINE-MPF 20 MG/ML (2 %) INJECTION SOLUTION: 20 mg/mL (2 %) | INTRAMUSCULAR | Qty: 10

## 2018-12-29 MED FILL — LACTATED RINGERS IV: INTRAVENOUS | Qty: 1000

## 2018-12-29 MED FILL — FUROSEMIDE 10 MG/ML IJ SOLN: 10 mg/mL | INTRAMUSCULAR | Qty: 4

## 2018-12-29 MED FILL — BELLADONNA ALKALOIDS-OPIUM 16.2 MG-30 MG RECTAL SUPPOSITORY: RECTAL | Qty: 1

## 2018-12-29 MED FILL — MIDAZOLAM (PF) 1 MG/ML INJECTION SOLUTION: 1 mg/mL | INTRAMUSCULAR | Qty: 2

## 2018-12-29 MED FILL — DIPRIVAN 10 MG/ML INTRAVENOUS EMULSION: 10 mg/mL | INTRAVENOUS | Qty: 20

## 2018-12-29 MED FILL — FENTANYL CITRATE (PF) 50 MCG/ML IJ SOLN: 50 mcg/mL | INTRAMUSCULAR | Qty: 2

## 2018-12-29 NOTE — Interval H&P Note (Signed)
Family updated.

## 2018-12-29 NOTE — Interval H&P Note (Signed)
Paged Dr. Katrinka Blazing for pt sign out.

## 2018-12-29 NOTE — Interval H&P Note (Signed)
Discharge instructions completed - opportunity for questions - AVS med list reviewed for duplicates

## 2018-12-29 NOTE — Anesthesia Post-Procedure Evaluation (Signed)
Post-Anesthesia Evaluation and Assessment    Cardiovascular Function/Vital Signs  Visit Vitals  BP 127/70   Pulse (!) 56   Temp 36.8 ??C (98.2 ??F)   Resp 14   Ht 6' 1" (1.854 m)   Wt 76.7 kg (169 lb)   SpO2 100%   BMI 22.30 kg/m??       Patient is status post Procedure(s):  UROLIFT.    Nausea/Vomiting: Controlled.    Postoperative hydration reviewed and adequate.    Pain:  Pain Scale 1: Numeric (0 - 10) (12/29/18 1526)  Pain Intensity 1: 0 (12/29/18 1526)   Managed.    Neurological Status:   Neuro (WDL): Within Defined Limits (12/29/18 1525)   At baseline.    Mental Status and Level of Consciousness: Baseline and appropriate for discharge.    Pulmonary Status:   O2 Device: Room air (12/29/18 1508)   Adequate oxygenation and airway patent.    Complications related to anesthesia: None    Post-anesthesia assessment completed. No concerns.    Patient has met all discharge requirements.    Signed By: Quillian Quince, MD    December 29, 2018

## 2018-12-29 NOTE — Interval H&P Note (Signed)
Notified Dr. Katrinka Blazing that patient stated that he drank 4 ounces of water at 0600 today. Okay to proceed.

## 2018-12-29 NOTE — Op Note (Signed)
Postoperative Note    Patient: BRIA PORTALES  Date of Birth: 11-26-1960  MRN: [id number]    Date of Procedure: 12/29/2018     Pre-Op Diagnosis: BPH WITH LUTS    Post-Op Diagnosis: Same as preoperative diagnosis.      Procedure(s):  UROLIFT 1+3    Surgeon(s):  Neoma Laming, MD    Surgical Assistant: None    Anesthesia: General     Estimated Blood Loss (mL): Minimal    Complications: None    Specimens: * No specimens in log *     Implants:   Implant Name Type Inv. Item Serial No. Manufacturer Lot No. LRB No. Used Action   IMPLANT UROLOGICAL UROLIFT - [id number]  IMPLANT UROLOGICAL UROLIFT  [employer] [id number] N/A 2 Implanted   IMPLANT UROLOGICAL UROLIFT - [id number]  IMPLANT UROLOGICAL UROLIFT  [employer] [id number] N/A 2 Implanted       Drains: * No LDAs found *      Pt brought into the operating room and placed into the supine position.  After administration of general anesthesia he was placed in to lithotomy position.  Groin and genitalia were prepped and draped in usual sterile fashion.  A 24F cystoscope was inserted into the bladder.  The cystoscopy bridge was replaced with a UroLift delivery device.  The first treatment site was the patient's left side approximately 1.5cm distal to the bladder neck. The distal tip of the delivery device was then angled laterally approximately 20 degrees at this position to compress the lateral lobe.  The trigger was pulled, thereby deploying a needle containing the implant through the prostate.  The needle was then retracted, allowing one end of the implant to be delivered to the capsular surface of the prostate.  The implant was then tensioned to assure capsular seating and removal of slack monofilament.  The device was then angled back toward midline and slowly advanced proximally (typically 3 to 4 mm) until cystoscopic verification of the monofilament being centered in the delivery bay. The urethral end piece was then affixed to the monofilament thereby tailoring the size of the  implant.  Excess filament was then severed.  The delivery device was then re-advanced into the bladder. The delivery device was then replaced with cystoscope and bridge and the implant location and opening effect was confirmed cystoscopically.  The same procedure was then repeated on the right side, and 2 additional implants were delivered just proximal to the veru montanum, again one on right and one on left side of the prostate, following the same technique. Cystoscopy was conducted first to inspect the location and state of each implant and second, to confirm the presence of a continuous anterior channel was present through the prostatic urethra with irrigation flow turned off.  The bladder was then emptied.  A foley catheter was placed with clear efflux of urine  The patient was extubated and transferred to recovery room in stable condition.

## 2018-12-29 NOTE — Interval H&P Note (Signed)
In accordance with the surgeon notification escalation protocol Barnet Pall, RN was contacted and informed that pt took Aspirin 81 mg 12/26/18.     Pattricia Boss states that she will inform Dr. Vivi Ferns and call back with any questions or concerns.

## 2018-12-29 NOTE — Anesthesia Pre-Procedure Evaluation (Signed)
Relevant Problems   No relevant active problems       Anesthetic History   No history of anesthetic complications            Review of Systems / Medical History  Patient summary reviewed, nursing notes reviewed and pertinent labs reviewed    Pulmonary  Within defined limits                 Neuro/Psych   Within defined limits           Cardiovascular    Hypertension                   GI/Hepatic/Renal  Within defined limits              Endo/Other  Within defined limits           Other Findings              Physical Exam    Airway  Mallampati: II  TM Distance: 4 - 6 cm  Neck ROM: normal range of motion   Mouth opening: Normal     Cardiovascular  Regular rate and rhythm,  S1 and S2 normal,  no murmur, click, rub, or gallop             Dental  No notable dental hx       Pulmonary  Breath sounds clear to auscultation               Abdominal  GI exam deferred       Other Findings            Anesthetic Plan    ASA: 1  Anesthesia type: general          Induction: Intravenous  Anesthetic plan and risks discussed with: Patient

## 2018-12-29 NOTE — Interval H&P Note (Signed)
Reviewed PTA medication list with patient/caregiver and patient/caregiver denies any additional medications.     Patient admits to having a responsible adult care for them at home for at least 24 hours after surgery.    Patient encouraged to use gown warming system and informed that using said warming gown to regulate body temperature prior to a procedure has been shown to help reduce the risks of blood clots and infection.    Dual skin assessment & fall risk band verification completed with Kortney Hollinger, RN.

## 2018-12-29 NOTE — Interval H&P Note (Signed)
Notified Dr. Katrinka Blazing of patient's creatinine and GFR lab results. Okay to proceed.  No new orders obtained.

## 2018-12-29 NOTE — Op Note (Signed)
Brief Postoperative Note    Patient: Justin Cruz  Date of Birth: 1961/03/06  MRN: [id number]    Date of Procedure: 12/29/2018     Pre-Op Diagnosis: BPH WITH LUTS    Post-Op Diagnosis: Same as preoperative diagnosis.      Procedure(s):  UROLIFT 1+3    Surgeon(s):  Sunday Spillers, MD    Surgical Assistant: None    Anesthesia: General     Estimated Blood Loss (mL): Minimal    Complications: None    Specimens: * No specimens in log *     Implants:   Implant Name Type Inv. Item Serial No. Manufacturer Lot No. LRB No. Used Action   IMPLANT UROLOGICAL UROLIFT - [id number]  IMPLANT UROLOGICAL UROLIFT  [employer] [id number] N/A 2 Implanted   IMPLANT UROLOGICAL UROLIFT - [id number]  IMPLANT UROLOGICAL UROLIFT  [employer] [id number] N/A 2 Implanted       Drains: * No LDAs found *    Findings: enlarged lateral lobes prostate    Electronically Signed by Sunday Spillers, MD on 12/29/2018 at 3:16 PM

## 2018-12-29 NOTE — Brief Op Note (Signed)
Brief Postoperative Note    Patient: Justin Cruz  Date of Birth: [birth year]  MRN: [id number]    Date of Procedure: 12/29/2018     Pre-Op Diagnosis: BPH WITH LUTS    Post-Op Diagnosis: Same as preoperative diagnosis.      Procedure(s):  UROLIFT 1+3    Surgeon(s):  Sunday Spillers, MD    Surgical Assistant: None    Anesthesia: General     Estimated Blood Loss (mL): Minimal    Complications: None    Specimens: * No specimens in log *     Implants:   Implant Name Type Inv. Item Serial No. Manufacturer Lot No. LRB No. Used Action   IMPLANT UROLOGICAL UROLIFT - [id number]  IMPLANT UROLOGICAL UROLIFT  [employer] [id number] N/A 2 Implanted   IMPLANT UROLOGICAL UROLIFT - [id number]  IMPLANT UROLOGICAL UROLIFT  [employer]WD [id number] N/A 2 Implanted       Drains: * No LDAs found *    Findings: enlarged lateral lobes prostate    Electronically Signed by Sunday Spillers, MD on 12/29/2018 at 3:16 PM

## 2018-12-29 NOTE — Other (Signed)
Notified Dr. Katrinka Blazing that patient stated that he drank 4 ounces of water at 0600 today. Okay to proceed.

## 2018-12-29 NOTE — Op Note (Signed)
Postoperative Note    Patient: Justin Cruz  Date of Birth: [birth year]  MRN: [id number]    Date of Procedure: 12/29/2018     Pre-Op Diagnosis: BPH WITH LUTS    Post-Op Diagnosis: Same as preoperative diagnosis.      Procedure(s):  UROLIFT 1+3    Surgeon(s):  Neoma Laming, MD    Surgical Assistant: None    Anesthesia: General     Estimated Blood Loss (mL): Minimal    Complications: None    Specimens: * No specimens in log *     Implants:   Implant Name Type Inv. Item Serial No. Manufacturer Lot No. LRB No. Used Action   IMPLANT UROLOGICAL UROLIFT - [id number]  IMPLANT UROLOGICAL UROLIFT  [employer] [id number] N/A 2 Implanted   IMPLANT UROLOGICAL UROLIFT - [id number]  IMPLANT UROLOGICAL UROLIFT  [employer] [id number] N/A 2 Implanted       Drains: * No LDAs found *      Pt brought into the operating room and placed into the supine position.  After administration of general anesthesia he was placed in to lithotomy position.  Groin and genitalia were prepped and draped in usual sterile fashion.  A 73F cystoscope was inserted into the bladder.  The cystoscopy bridge was replaced with a UroLift delivery device.  The first treatment site was the patient's left side approximately 1.5cm distal to the bladder neck. The distal tip of the delivery device was then angled laterally approximately 20 degrees at this position to compress the lateral lobe.  The trigger was pulled, thereby deploying a needle containing the implant through the prostate.  The needle was then retracted, allowing one end of the implant to be delivered to the capsular surface of the prostate.  The implant was then tensioned to assure capsular seating and removal of slack monofilament.  The device was then angled back toward midline and slowly advanced proximally (typically 3 to 4 mm) until cystoscopic verification of the monofilament being centered in the delivery bay. The urethral end piece was then affixed to the monofilament thereby tailoring the size of  the implant.  Excess filament was then severed.  The delivery device was then re-advanced into the bladder. The delivery device was then replaced with cystoscope and bridge and the implant location and opening effect was confirmed cystoscopically.  The same procedure was then repeated on the right side, and 2 additional implants were delivered just proximal to the veru montanum, again one on right and one on left side of the prostate, following the same technique. Cystoscopy was conducted first to inspect the location and state of each implant and second, to confirm the presence of a continuous anterior channel was present through the prostatic urethra with irrigation flow turned off.  The bladder was then emptied.  A foley catheter was placed with clear efflux of urine  The patient was extubated and transferred to recovery room in stable condition.

## 2018-12-29 NOTE — Other (Signed)
Paged Dr. Smith for pt sign out.

## 2018-12-29 NOTE — Other (Signed)
Family updated

## 2018-12-29 NOTE — Other (Signed)
Discharge instructions completed - opportunity for questions - AVS med list reviewed for duplicates

## 2018-12-29 NOTE — Other (Signed)
Notified Dr. Katrinka Blazing of patient's creatinine and GFR lab results. Okay to proceed.  No new orders obtained.

## 2018-12-29 NOTE — Interval H&P Note (Signed)
Update History & Physical    The Patient's History and Physical of December 23, 2018 was reviewed with the patient and I examined the patient.  There was no change.  The surgical site was confirmed by the patient and me.    Plan:  The risk, benefits, expected outcome, and alternative to the recommended procedure have been discussed with the patient.  Patient understands and wants to proceed with the procedure.    Electronically signed by Sunday Spillers, MD on 12/29/2018 at 12:02 PM

## 2018-12-29 NOTE — Other (Signed)
In accordance with the surgeon notification escalation protocol Annie Fetter, RN was contacted and informed that Justin Cruz took Aspirin 81 mg 12/26/18.     Annie states that she will inform Dr. Darby and call back with any questions or concerns.

## 2018-12-29 NOTE — Anesthesia Post-Procedure Evaluation (Signed)
Post-Anesthesia Evaluation and Assessment    Cardiovascular Function/Vital Signs  Visit Vitals  BP 127/70   Pulse (!) 56   Temp 36.8 ??C (98.2 ??F)   Resp 14   Ht 6' 1" (1.854 m)   Wt 76.7 kg (169 lb)   SpO2 100%   BMI 22.30 kg/m??       Patient is status post Procedure(s):  UROLIFT.    Nausea/Vomiting: Controlled.    Postoperative hydration reviewed and adequate.    Pain:  Pain Scale 1: Numeric (0 - 10) (12/29/18 1526)  Pain Intensity 1: 0 (12/29/18 1526)   Managed.    Neurological Status:   Neuro (WDL): Within Defined Limits (12/29/18 1525)   At baseline.    Mental Status and Level of Consciousness: Baseline and appropriate for discharge.    Pulmonary Status:   O2 Device: Room air (12/29/18 1508)   Adequate oxygenation and airway patent.    Complications related to anesthesia: None    Post-anesthesia assessment completed. No concerns.    Patient has met all discharge requirements.    Signed By: Korie Brabson, MD    December 29, 2018

## 2018-12-29 NOTE — Other (Signed)
Reviewed PTA medication list with patient/caregiver and patient/caregiver denies any additional medications.     Patient admits to having a responsible adult care for them at home for at least 24 hours after surgery.    Patient encouraged to use gown warming system and informed that using said warming gown to regulate body temperature prior to a procedure has been shown to help reduce the risks of blood clots and infection.    Dual skin assessment & fall risk band verification completed with Kortney Hollinger, RN.

## 2018-12-29 NOTE — Anesthesia Pre-Procedure Evaluation (Signed)
Relevant Problems   No relevant active problems       Anesthetic History   No history of anesthetic complications            Review of Systems / Medical History  Patient summary reviewed, nursing notes reviewed and pertinent labs reviewed    Pulmonary  Within defined limits                 Neuro/Psych   Within defined limits           Cardiovascular    Hypertension                   GI/Hepatic/Renal  Within defined limits              Endo/Other  Within defined limits           Other Findings              Physical Exam    Airway  Mallampati: II  TM Distance: 4 - 6 cm  Neck ROM: normal range of motion   Mouth opening: Normal     Cardiovascular  Regular rate and rhythm,  S1 and S2 normal,  no murmur, click, rub, or gallop             Dental  No notable dental hx       Pulmonary  Breath sounds clear to auscultation               Abdominal  GI exam deferred       Other Findings            Anesthetic Plan    ASA: 1  Anesthesia type: general          Induction: Intravenous  Anesthetic plan and risks discussed with: Patient

## 2019-01-02 ENCOUNTER — Encounter: Payer: PRIVATE HEALTH INSURANCE | Attending: Urology | Primary: Family Medicine

## 2019-02-01 ENCOUNTER — Inpatient Hospital Stay: Admit: 2019-02-01 | Payer: BLUE CROSS/BLUE SHIELD | Primary: Family Medicine

## 2019-02-01 DIAGNOSIS — Z01812 Encounter for preprocedural laboratory examination: Secondary | ICD-10-CM

## 2019-02-01 NOTE — H&P (Signed)
H&P by Sunday Spillers, MD at 02/01/19  1929                Author: Sunday Spillers, MD  Service: Urology  Author Type: Physician       Filed: 02/01/19 1930  Date of Service: 02/01/19 1929  Status: Signed          Editor: Sunday Spillers, MD (Physician)                    [facility]   [address]   [place] [address]   Tel: [phone]   Fax: [phone]         Patient: Justin Cruz   Date of Birth: 1960/12/26   Date: 02/01/2019 11:00 AM    Visit Type: Office Visit            Assessment/Plan       #  Detail Type  Description          1.  Assessment  BPH w urinary obs/LUTS (N40.1).           Patient Plan    Patient with a long history of BPH.  He underwent a UroLift approximately 5 weeks ago.  He has not done very well.  He cannot empty his bladder  he continues to  have a high postvoid residual.  He is having to perform intermittent catheterizations 4 times a day.  He has had 2 urinary tract infections several hospital visits to the ER and twice have a Foley catheter placed does he could not perform  intermittent catheterization.  We discussed options and I explained to him that the only option available would be to perform a GreenLight laser of the prostate all risks including pain infection bleeding need for catheter as well as retrograde ejaculation  were reviewed and he understood and agreed.  At this time he states that he wants something done as soon as possible so he can move along with his life.                       2.  Assessment  Retention of urine, unspecified (R33.9).                       3.  Assessment  Feeling of incomplete bladder emptying (R39.14).           Patient Plan                         4.  Assessment  Poor urinary stream (R39.12).                                  This 58 year old male presents for  BPH, Renal Mass and Hydronephrosis.   History of Present Illness:   1.  BPH    Onset was gradual. Severity level is severe. It occurs daily. The problem is worse.  Reviewed today was a PSA taken on 11/22/2018 with  findings of 2.80 ng/mL. Associated symptoms include incomplete emptying, nocturia (4 times per night), slow stream, urgency and urinary frequency. Pertinent negatives include chills, constipation, fever, hematuria and urinary incontinence. Additional  information: Patient by symptomatology appears have some mild BPH issues.  However he has an elevated postvoid residual on a recent ultrasound and today his postvoid residual was close to  400 mL.  He was given Flomax no change but had alot of side effects.   It sounded like he had orthostatic hypotension which was causing him some dizziness and making very tired.             08/10/2018   Pt ns is working better ow on Alfuzosin , he feels that it is working better.  He feels his bladder is voiding   11/30/2018   Patient is here today.  He continues to void better using alfuzosin however he has a very high postvoid residual still at 422 mL discussed options I am going to set him up for a cystoscopy and uroflow study to get a better idea of his bladder function.   12/17/2018   Patient is here today to undergo a cystoscopy and uroflow study to better evaluate his urinary symptoms.   12/23/2018    patient here today for a prostate ultrasound.  The he wants to undergo a UroLift however he needs to have a prostate ultrasound to prove that his prostate is under 80 g before it is approved.   02/01/2019    Patient is here today for a follow-up he is almost 5 weeks from a UroLift.  He continues to have retention requiring intermittent catheterizations up to 4 times per day.  He has strong urges to void and only is able to urinate a small amount.  He has  already had 2 urgency room visits and has been diagnosed with urinary tract infections both times.  I reassured him that this is probably due to performing intermittent catheterizations.  He states that he is miserable he can sleep because he is up all  night every few hours  urinating frequent small amounts.  He continues to also have high residuals when he catheterizes before bed.  Most recently he has had a difficult time performing intermittent catheterization and has had Foley catheters placed twice  as well.  The patient states that he  cannot keep doing this on a regular basis in needs relief.  We discussed other options I explained him that the only surgical option that would help would be a GreenLight laser of the prostate which should open up  the prostatic urethra and should allow him to void on his own much better than he is at this time.   2.  Renal Mass    Onset was sudden.  Pain level is mild.  Multiple masses are present.  Quality of the mass: incidental cyst.  There are no identifying  factors.  No treatment has been performed.  Associated symptoms include frequency (urinary).  Pertinent negatives include fever, headache, hematuria, nausea, urgency and vomiting.  Additional information: Patient with incidental cysts of the kidney bilaterally.   These were seen on ultrasound.  I am obtaining a CT scan of the abdomen pelvis with without contrast that should hopefully allow [place] to get more information on the cysts.             3.  Hydronephrosis    Severity level is severe. Location of pain is None. The pain does not radiate. Prior stone type of unknown. There are no aggravating  factors. The patient lists no relieving factors. Associated symptoms include urinary frequency. Pertinent negatives include chills, constipation, diarrhea, fever, hematuria, nausea and vomiting.             Pt has a hx of having an atrophic left  kidney.  This was noted when he went for  life insurance in his 75s.  He also had a recent renal ultrasound revealing mild-to-moderate hydronephrosis of the left kidney as well as bilateral renal cysts and a high postvoid residual.  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  I am going to obtain a CT scan the abdomen pelvis to better evaluate  patient''''''''''''''''''''''''''''''''s renal anatomy   08/10/2018   Patient is here today for follow-up he underwent a CT scan to better evaluate.  The left hydronephrosis and renal cysts.  The CT showed no evidence of hydronephrosis, left greater than right-sided renal atrophy no evidence of cyst punctate nonobstructing  stones left kidney moderately distended bladder no other abnormalities noted.  At this time no further imaging seems to be needed.               PAST MEDICAL/SURGICAL HISTORY   (Reviewed, updated)            Disease/disorder  Onset Date  Management  Date  Comments               Urolift 1+3  12/29/2018       Hypertension                   kidney disease                      PROBLEM LIST:   Problem List reviewed.             Medications (active prior to today)          Medication Name  Sig Description  Start Date  Stop Date  Refilled  Rx Elsewhere            Aspir-81 mg tablet,delayed release  take 1 tablet by oral route  every day  //      Y            Uloric 40 mg tablet  take 1 tablet by oral route  every day  //      Y            telmisartan 20 mg tablet  take 1 tablet by oral route  every day  //      Y            multivitamin capsule  take 1 capsule by oral route  every day  //      Y            alfuzosin ER 10 mg tablet,extended release 24 hr  take 1 tablet by oral route  every day  07/26/2018  02/01/2019    N            tramadol 50 mg tablet  take 1 tablet by oral route  every 6 hours as needed as needed  12/29/2018  02/01/2019    N            tamsulosin 0.4 mg capsule  take 2 capsule by ORAL route  every day 1/2 hour following the same meal each day  01/10/2019  02/01/2019  01/10/2019  N            amoxicillin 500 mg tablet  take 1 tablet by oral route  every 12 hours  01/17/2019      N        Medication Reconciliation   Medications reconciled today.   Medication Reviewed         Adherence  Medication Name  [employer]  Elsewhere  Status           taking as directed  Aspir-81 mg tablet,delayed  release  take 1 tablet by oral route  every day  Y  Verified           taking as directed  Uloric 40 mg tablet  take 1 tablet by oral route  every day  Y  Verified           taking as directed  telmisartan 20 mg tablet  take 1 tablet by oral route  every day  Y  Verified           taking as directed  multivitamin capsule  take 1 capsule by oral route  every day  Y  Verified           taking as directed  amoxicillin 500 mg tablet  take 1 tablet by oral route  every 12 hours  N  Verified        Allergies        Ingredient  Reaction (Severity)  Medication Name  Comment          NO KNOWN ALLERGIES              Reviewed, no changes.   Family History   (Reviewed, updated)           Relationship  Family Member Name  Deceased  Age at Death  Condition  Onset Age  Cause of Death             Father        Myocardial infarction                 Mother        Cancer                  Social History:  (Reviewed, updated)   Tobacco use reviewed.   Preferred language is [place].     MARITAL STATUS/FAMILY/SOCIAL SUPPORT     Marital status: Married     Tobacco use status: Current non-smoker.   Smoking status: Never smoker.      TOBACCO SCREENING:   Patient has never used tobacco. Patient has not used tobacco in the last 30 days. Patient has not used smokeless tobacco in the last 30 days.      SMOKING STATUS          Type  Smoking Status  Usage Per Day  Years Used  Pack Years  Total Pack Years              Never smoker                ALCOHOL   There is a history of alcohol use.    Type: Beer. 2 beers consumed daily.   CAFFEINE   The patient does not use caffeine.                           Review of Systems       System  Neg/Pos  Details         Constitutional  Negative  Chills and Fever.         ENMT  Negative  Ear infections and Sore throat.         Eyes  Negative  Blurred vision, Double vision and Eye pain.         Respiratory  Negative  Asthma, Chronic cough, Dyspnea and Wheezing.  Cardio  Negative  Chest pain.         GI   Negative  Constipation, Decreased appetite, Diarrhea, Nausea and Vomiting.         GU  Positive  Incomplete emptying, Nocturia, Slow stream, Urgency, Urinary frequency.         GU  Negative  Hematuria and Urinary incontinence.         Endocrine  Negative  Cold intolerance, Heat intolerance, Increased thirst and Weight loss.         Neuro  Negative  Headache and Tremors.         Psych  Negative  Anxiety and Depression.         Integumentary  Negative  Itching skin and Rash.         MS  Negative  Back pain and Joint pain.         Hema/Lymph  Negative  Easy bleeding.         Reproductive  Negative  Sexual dysfunction.          Vital Signs     Height          Time  ft  in  cm  Last Measured  Height Position            11:20 AM  6.0  1.00  185.42  07/26/2018  0     Weight/BSA/BMI           Time  lb  oz  kg  Context  BMI kg/m2  BSA m2             11:20 AM  164.00    74.389    21.64       Measured By      Time  Measured by        11:20 AM  Leonard SchwartzLeah Perry        Physical Exam       Exam  Findings  Details         Constitutional  Normal  Well developed.     Neck Exam  Normal  Inspection - Normal.     Respiratory  Normal  Inspection - Normal.     Extremity  Normal  No edema.     Neurological  Normal  Alert and oriented to person, place and time.  Cranial nerves intact.  No motor or sensory deficits.         Psychiatric  Normal  Orientation - Oriented to time, place, person & situation. Appropriate mood and affect.              Procedures:         Uroflow:   Feeling of incomplete bladder emptying R39.14.   Consent was obtained. The procedure and risks were explained in detail. Questions were encouraged and answered. Patient was prepped and draped in the usual fashion.   Procedure:    Sonographic residual urine: 408mL. Time after void: 15 Minutes. Comments:Pt PVR was >47608ml after voiding 50-100cc 15min ago. (w/o CIC). Physician: Sunday SpillersEric Garnet Overfield, MD. Date: 02/01/2019. Time: 11:22 AM.   Post procedure:   Instructions:.         Patient  Education      #  Patient Education        1.  Benign Prostatic Hyperplasia: Care In~        Medications (added, continued, or stopped today)           Start Date  Medication  Directions  PRN Status  PRN Reason  Instruction  Stop Date             07/26/2018  alfuzosin ER 10 mg tablet,extended release 24 hr  take 1 tablet by oral route  every day  N      02/01/2019     01/17/2019  amoxicillin 500 mg tablet  take 1 tablet by oral route  every 12 hours  N             Aspir-81 mg tablet,delayed release  take 1 tablet by oral route  every day  N             multivitamin capsule  take 1 capsule by oral route  every day  N           01/10/2019  tamsulosin 0.4 mg capsule  take 2 capsule by ORAL route  every day 1/2 hour following the same meal each day  N      02/01/2019       telmisartan 20 mg tablet  take 1 tablet by oral route  every day  N           12/29/2018  tramadol 50 mg tablet  take 1 tablet by oral route  every 6 hours as needed as needed  Y      02/01/2019               Uloric 40 mg tablet  take 1 tablet by oral route  every day  N           Active Patient Care Team Members               Name  Contact  Agency Type  Support Role  Relationship  Active Date  Inactive Date  Specialty              Sunday Spillers      encounter provider        Urology     Almyra Deforest      Emergency Contact  Spouse                    Maxcine Ham      Patient provider  PCP                 Provider:  Sunday Spillers, MD 02/01/2019 11:00 AM   Document generated by:   Stevphen Meuse. Ilaria Much  02/01/2019 12:34 PM

## 2019-02-01 NOTE — Interval H&P Note (Signed)
Covid guidelines reviewed. Self-Quarantine reviewed and patient instructed to report any symptoms of Covid to Surgeon, or report to surgeon if unable to comply with guidelines. Patient instructed to leave all valuables at home including cell phone. Patient to come for Covid test on 10-21. Patient had surgery last month, pre-post routine reviewed. Patient just added on this morning for surgery tomorrow.

## 2019-02-01 NOTE — Other (Signed)
Covid guidelines reviewed. Self-Quarantine reviewed and patient instructed to report any symptoms of Covid to Surgeon, or report to surgeon if unable to comply with guidelines. Patient instructed to leave all valuables at home including cell phone. Patient to come for Covid test on 10-21. Patient had surgery last month, pre-post routine reviewed. Patient just added on this morning for surgery tomorrow.

## 2019-02-01 NOTE — H&P (Signed)
Urology [facility]  [address]  [place] [address]  Tel: [phone]  Fax: [phone]      Patient: Justin Cruz  Date of Birth: [birth year]  Date: 02/01/2019 11:00 AM   Visit Type: Office Visit        Assessment/Plan  # Detail Type Description    1. 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    Patient Plan   Patient with a long history of BPH.  He underwent a UroLift approximately 5 weeks ago.  He has not done very well.  He cannot empty his bladder he continues to  have a high postvoid residual.  He is having to perform intermittent catheterizations 4 times a day.  He has had 2 urinary tract infections several hospital visits to the ER and twice have a Foley catheter placed does he could not perform intermittent catheterization.  We discussed options and I explained to him that the only option available would be to perform a GreenLight laser of the prostate all risks including pain infection bleeding need for catheter as well as retrograde ejaculation were reviewed and he understood and agreed.  At this time he states that he wants something done as soon as possible so he can move along with his life.         2. Assessment Retention of urine, unspecified (R33.9).         3. Assessment Feeling of incomplete bladder emptying (R39.14).    Patient Plan          4. Assessment Poor urinary stream (R39.12).                   This 58 year old male presents for BPH, Renal Mass and Hydronephrosis.  History of Present Illness:  1.  BPH   Onset was gradual. Severity level is severe. It occurs daily. The problem is worse. Reviewed today was a PSA taken on 11/22/2018 with findings of 2.80 ng/mL. Associated symptoms include incomplete emptying, nocturia (4 times per night), slow stream, urgency and urinary frequency. Pertinent negatives include chills, constipation, fever, hematuria and urinary incontinence. Additional information: Patient by symptomatology appears  have some mild BPH issues.  However he has an elevated postvoid residual on a recent ultrasound and today his postvoid residual was close to 400 mL.  He was given Flomax no change but had alot of side effects.  It sounded like he had orthostatic hypotension which was causing him some dizziness and making very tired.      08/10/2018  Pt ns is working better ow on Alfuzosin , he feels that it is working better.  He feels his bladder is voiding  11/30/2018  Patient is here today.  He continues to void better using alfuzosin however he has a very high postvoid residual still at 422 mL discussed options I am going to set him up for a cystoscopy and uroflow study to get a better idea of his bladder function.  12/17/2018  Patient is here today to undergo a cystoscopy and uroflow study to better evaluate his urinary symptoms.  12/23/2018   patient here today for a prostate ultrasound.  The he wants to undergo a UroLift however he needs to have a prostate ultrasound to prove that his prostate is under 80 g before it is approved.  02/01/2019   Patient is here today for a follow-up he is almost 5 weeks from a UroLift.  He continues to have retention requiring intermittent catheterizations up  to 4 times per day.  He has strong urges to void and only is able to urinate a small amount.  He has already had 2 urgency room visits and has been diagnosed with urinary tract infections both times.  I reassured him that this is probably due to performing intermittent catheterizations.  He states that he is miserable he can sleep because he is up all night every few hours urinating frequent small amounts.  He continues to also have high residuals when he catheterizes before bed.  Most recently he has had a difficult time performing intermittent catheterization and has had Foley catheters placed twice as well.  The patient states that he  cannot keep doing this on a regular basis in needs relief.  We discussed other options  I explained him that the only surgical option that would help would be a GreenLight laser of the prostate which should open up the prostatic urethra and should allow him to void on his own much better than he is at this time.  2.  Renal Mass   Onset was sudden.  Pain level is mild.  Multiple masses are present.  Quality of the mass: incidental cyst.  There are no identifying factors.  No treatment has been performed.  Associated symptoms include frequency (urinary).  Pertinent negatives include fever, headache, hematuria, nausea, urgency and vomiting.  Additional information: Patient with incidental cysts of the kidney bilaterally.  These were seen on ultrasound.  I am obtaining a CT scan of the abdomen pelvis with without contrast that should hopefully allow [place] to get more information on the cysts.      3.  Hydronephrosis   Severity level is severe. Location of pain is None. The pain does not radiate. Prior stone type of unknown. There are no aggravating factors. The patient lists no relieving factors. Associated symptoms include urinary frequency. Pertinent negatives include chills, constipation, diarrhea, fever, hematuria, nausea and vomiting.      Pt has a hx of having an atrophic left kidney.  This was noted when he went for life insurance in his 22s.  He also had a recent renal ultrasound revealing mild-to-moderate hydronephrosis of the left kidney as well as bilateral renal cysts and a high postvoid residual.  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  I am going to obtain a CT scan the abdomen pelvis to better evaluate patient''''''''''''''''''''''''''''''''s renal anatomy  08/10/2018  Patient is here today for follow-up he underwent a CT scan to better evaluate.  The left hydronephrosis and renal cysts.  The CT showed no evidence of hydronephrosis, left greater than right-sided renal atrophy no evidence of cyst punctate nonobstructing stones left kidney moderately  distended bladder no other abnormalities noted.  At this time no further imaging seems to be needed.          PAST MEDICAL/SURGICAL HISTORY   (Reviewed, updated)    Disease/disorder Onset Date Management Date Comments     Urolift 1+3 12/29/2018    Hypertension       kidney disease             PROBLEM LIST:   Problem List reviewed.         Medications (active prior to today)  Medication Name Sig Description Start Date Stop Date Refilled Rx Elsewhere   Aspir-81 mg tablet,delayed release take 1 tablet by oral route  every day //   Y   Uloric 40 mg tablet take 1 tablet by oral route  every  day //   Y   telmisartan 20 mg tablet take 1 tablet by oral route  every day //   Y   multivitamin capsule take 1 capsule by oral route  every day //   Y   alfuzosin ER 10 mg tablet,extended release 24 hr take 1 tablet by oral route  every day 07/26/2018 02/01/2019  N   tramadol 50 mg tablet take 1 tablet by oral route  every 6 hours as needed as needed 12/29/2018 02/01/2019  N   tamsulosin 0.4 mg capsule take 2 capsule by ORAL route  every day 1/2 hour following the same meal each day 01/10/2019 02/01/2019 01/10/2019 N   amoxicillin 500 mg tablet take 1 tablet by oral route  every 12 hours 01/17/2019   N     Medication Reconciliation  Medications reconciled today.  Medication Reviewed  Adherence Medication Name Sig Desc Elsewhere Status   taking as directed Aspir-81 mg tablet,delayed release take 1 tablet by oral route  every day Y Verified   taking as directed Uloric 40 mg tablet take 1 tablet by oral route  every day Y Verified   taking as directed telmisartan 20 mg tablet take 1 tablet by oral route  every day Y Verified   taking as directed multivitamin capsule take 1 capsule by oral route  every day Y Verified   taking as directed amoxicillin 500 mg tablet take 1 tablet by oral route  every 12 hours N Verified     Allergies  Ingredient Reaction (Severity) Medication Name Comment   NO KNOWN ALLERGIES        Reviewed, no changes.   Family History  (Reviewed, updated)  Relationship Family Member Name Deceased Age at Death Condition Onset Age Cause of Death   Father    Myocardial infarction     Mother    Cancer           Social History:  (Reviewed, updated)  Tobacco use reviewed.  Preferred language is [place].    MARITAL STATUS/FAMILY/SOCIAL SUPPORT  Marital status: Married   Tobacco use status: Current non-smoker.  Smoking status: Never smoker.    TOBACCO SCREENING:  Patient has never used tobacco. Patient has not used tobacco in the last 30 days. Patient has not used smokeless tobacco in the last 30 days.    SMOKING STATUS  Type Smoking Status Usage Per Day Years Used Pack Years Total Pack Years    Never smoker         ALCOHOL  There is a history of alcohol use.   Type: Beer. 2 beers consumed daily.  CAFFEINE  The patient does not use caffeine.                  Review of Systems  System Neg/Pos Details   Constitutional Negative Chills and Fever.   ENMT Negative Ear infections and Sore throat.   Eyes Negative Blurred vision, Double vision and Eye pain.   Respiratory Negative Asthma, Chronic cough, Dyspnea and Wheezing.   Cardio Negative Chest pain.   GI Negative Constipation, Decreased appetite, Diarrhea, Nausea and Vomiting.   GU Positive Incomplete emptying, Nocturia, Slow stream, Urgency, Urinary frequency.   GU Negative Hematuria and Urinary incontinence.   Endocrine Negative Cold intolerance, Heat intolerance, Increased thirst and Weight loss.   Neuro Negative Headache and Tremors.   Psych Negative Anxiety and Depression.   Integumentary Negative Itching skin and Rash.   MS Negative Back pain and Joint pain.  Hema/Lymph Negative Easy bleeding.   Reproductive Negative Sexual dysfunction.     Vital Signs   Height  Time ft in cm Last Measured Height Position   11:20 AM 6.0 1.00 185.42 07/26/2018 0   Weight/BSA/BMI  Time lb oz kg Context BMI kg/m2 BSA m2   11:20 AM 164.00  74.389  21.64    Measured By  Time Measured by    11:20 AM Leonard SchwartzLeah Perry     Physical Exam  Exam Findings Details   Constitutional Normal Well developed.   Neck Exam Normal Inspection - Normal.   Respiratory Normal Inspection - Normal.   Extremity Normal No edema.   Neurological Normal Alert and oriented to person, place and time.  Cranial nerves intact.  No motor or sensory deficits.   Psychiatric Normal Orientation - Oriented to time, place, person & situation. Appropriate mood and affect.         Procedures:      Uroflow:  Feeling of incomplete bladder emptying R39.14.  Consent was obtained. The procedure and risks were explained in detail. Questions were encouraged and answered. Patient was prepped and draped in the usual fashion.  Procedure:   Sonographic residual urine: 408mL. Time after void: 15 Minutes. Comments:Pt PVR was >46708ml after voiding 50-100cc 15min ago. (w/o CIC). Physician: Sunday SpillersEric Orien Mayhall, MD. Date: 02/01/2019. Time: 11:22 AM.  Post procedure:  Instructions:.      Patient Education  # Patient Education   1. Benign Prostatic Hyperplasia: Care In~     Medications (added, continued, or stopped today)  Start Date Medication Directions PRN Status PRN Reason Instruction Stop Date   07/26/2018 alfuzosin ER 10 mg tablet,extended release 24 hr take 1 tablet by oral route  every day N   02/01/2019   01/17/2019 amoxicillin 500 mg tablet take 1 tablet by oral route  every 12 hours N       Aspir-81 mg tablet,delayed release take 1 tablet by oral route  every day N       multivitamin capsule take 1 capsule by oral route  every day N      01/10/2019 tamsulosin 0.4 mg capsule take 2 capsule by ORAL route  every day 1/2 hour following the same meal each day N   02/01/2019    telmisartan 20 mg tablet take 1 tablet by oral route  every day N      12/29/2018 tramadol 50 mg tablet take 1 tablet by oral route  every 6 hours as needed as needed Y   02/01/2019    Uloric 40 mg tablet take 1 tablet by oral route  every day N      Active Patient Care Team Members     Name Contact Agency Type Support Role Relationship Active Date Inactive Date Specialty   Sunday SpillersEric Francess Mullen   encounter provider    Urology   Almyra DeforestJennifer Deal   Emergency Contact Spouse      Maxcine HamStefan Brecke   Patient provider PCP          Provider: Sunday SpillersEric  Rishit Burkhalter, MD 02/01/2019 11:00 AM  Document generated by:  Stevphen MeuseEric C. Derotha Fishbaugh 02/01/2019 12:34 PM

## 2019-02-02 ENCOUNTER — Inpatient Hospital Stay: Payer: BLUE CROSS/BLUE SHIELD

## 2019-02-02 ENCOUNTER — Inpatient Hospital Stay: Payer: BLUE CROSS/BLUE SHIELD | Primary: Family Medicine

## 2019-02-02 MED ORDER — FUROSEMIDE 10 MG/ML IJ SOLN
10 mg/mL | INTRAMUSCULAR | Status: AC
Start: 2019-02-02 — End: ?

## 2019-02-02 MED ORDER — OXYCODONE-ACETAMINOPHEN 5 MG-325 MG TAB
5-325 mg | ORAL | Status: DC | PRN
Start: 2019-02-02 — End: 2019-02-02

## 2019-02-02 MED ORDER — PHENYLEPHRINE 10 MG/ML INJECTION
10 mg/mL | INTRAMUSCULAR | Status: DC | PRN
Start: 2019-02-02 — End: 2019-02-02
  Administered 2019-02-02: 17:00:00 via INTRAVENOUS

## 2019-02-02 MED ORDER — ONDANSETRON (PF) 4 MG/2 ML INJECTION
4 mg/2 mL | INTRAMUSCULAR | Status: DC | PRN
Start: 2019-02-02 — End: 2019-02-02
  Administered 2019-02-02: 17:00:00 via INTRAVENOUS

## 2019-02-02 MED ORDER — BELLADONNA ALKALOIDS-OPIUM 16.2 MG-30 MG RECTAL SUPPOSITORY
RECTAL | Status: AC
Start: 2019-02-02 — End: ?

## 2019-02-02 MED ORDER — BELLADONNA ALKALOIDS-OPIUM 16.2 MG-30 MG RECTAL SUPPOSITORY
RECTAL | Status: DC | PRN
Start: 2019-02-02 — End: 2019-02-02
  Administered 2019-02-02: 17:00:00 via RECTAL

## 2019-02-02 MED ORDER — DEXAMETHASONE SODIUM PHOSPHATE 4 MG/ML IJ SOLN
4 mg/mL | INTRAMUSCULAR | Status: DC | PRN
Start: 2019-02-02 — End: 2019-02-02
  Administered 2019-02-02: 17:00:00 via INTRAVENOUS

## 2019-02-02 MED ORDER — NALOXONE 0.4 MG/ML INJECTION
0.4 mg/mL | INTRAMUSCULAR | Status: DC | PRN
Start: 2019-02-02 — End: 2019-02-02

## 2019-02-02 MED ORDER — ONDANSETRON (PF) 4 MG/2 ML INJECTION
4 mg/2 mL | Freq: Once | INTRAMUSCULAR | Status: AC
Start: 2019-02-02 — End: 2019-02-02
  Administered 2019-02-02: 19:00:00 via INTRAVENOUS

## 2019-02-02 MED ORDER — LEVOFLOXACIN IN D5W 500 MG/100 ML IV PIGGY BACK
500 mg/100 mL | Freq: Once | INTRAVENOUS | Status: AC
Start: 2019-02-02 — End: 2019-02-02
  Administered 2019-02-02: 17:00:00 via INTRAVENOUS

## 2019-02-02 MED ORDER — FUROSEMIDE 10 MG/ML IJ SOLN
10 mg/mL | INTRAMUSCULAR | Status: DC | PRN
Start: 2019-02-02 — End: 2019-02-02
  Administered 2019-02-02: 18:00:00 via INTRAVENOUS

## 2019-02-02 MED ORDER — LIDOCAINE (PF) 20 MG/ML (2 %) IJ SOLN
20 mg/mL (2 %) | INTRAMUSCULAR | Status: DC | PRN
Start: 2019-02-02 — End: 2019-02-02
  Administered 2019-02-02: 17:00:00 via INTRAVENOUS

## 2019-02-02 MED ORDER — LACTATED RINGERS IV
INTRAVENOUS | Status: DC
Start: 2019-02-02 — End: 2019-02-02
  Administered 2019-02-02 (×3): via INTRAVENOUS

## 2019-02-02 MED ORDER — ONDANSETRON (PF) 4 MG/2 ML INJECTION
4 mg/2 mL | INTRAMUSCULAR | Status: AC
Start: 2019-02-02 — End: ?

## 2019-02-02 MED ORDER — HYDROMORPHONE (PF) 1 MG/ML IJ SOLN
1 mg/mL | INTRAMUSCULAR | Status: DC | PRN
Start: 2019-02-02 — End: 2019-02-02

## 2019-02-02 MED ORDER — FENTANYL CITRATE (PF) 50 MCG/ML IJ SOLN
50 mcg/mL | INTRAMUSCULAR | Status: DC | PRN
Start: 2019-02-02 — End: 2019-02-02
  Administered 2019-02-02 (×2): via INTRAVENOUS

## 2019-02-02 MED ORDER — LACTATED RINGERS IV
INTRAVENOUS | Status: DC
Start: 2019-02-02 — End: 2019-02-02

## 2019-02-02 MED ORDER — GLYCOPYRROLATE 0.2 MG/ML IJ SOLN
0.2 mg/mL | INTRAMUSCULAR | Status: DC | PRN
Start: 2019-02-02 — End: 2019-02-02
  Administered 2019-02-02: 17:00:00 via INTRAVENOUS

## 2019-02-02 MED ORDER — MIDAZOLAM (PF) 1 MG/ML INJECTION SOLUTION
1 mg/mL | INTRAMUSCULAR | Status: AC
Start: 2019-02-02 — End: ?

## 2019-02-02 MED ORDER — FENTANYL CITRATE (PF) 50 MCG/ML IJ SOLN
50 mcg/mL | INTRAMUSCULAR | Status: DC | PRN
Start: 2019-02-02 — End: 2019-02-02
  Administered 2019-02-02 (×4): via INTRAVENOUS

## 2019-02-02 MED ORDER — DEXAMETHASONE SODIUM PHOSPHATE 4 MG/ML IJ SOLN
4 mg/mL | INTRAMUSCULAR | Status: AC
Start: 2019-02-02 — End: ?

## 2019-02-02 MED ORDER — PHENYLEPHRINE 1 MG/10 ML (100 MCG/ML) IN NS IV SYRINGE
1 mg/0 mL (00 mcg/mL) | INTRAVENOUS | Status: AC
Start: 2019-02-02 — End: ?

## 2019-02-02 MED ORDER — GLYCOPYRROLATE 0.2 MG/ML IJ SOLN
0.2 mg/mL | INTRAMUSCULAR | Status: AC
Start: 2019-02-02 — End: ?

## 2019-02-02 MED ORDER — PROPOFOL 10 MG/ML IV EMUL
10 mg/mL | INTRAVENOUS | Status: AC
Start: 2019-02-02 — End: ?

## 2019-02-02 MED ORDER — MIDAZOLAM 1 MG/ML IJ SOLN
1 mg/mL | INTRAMUSCULAR | Status: DC | PRN
Start: 2019-02-02 — End: 2019-02-02
  Administered 2019-02-02: 17:00:00 via INTRAVENOUS

## 2019-02-02 MED ORDER — FENTANYL CITRATE (PF) 50 MCG/ML IJ SOLN
50 mcg/mL | INTRAMUSCULAR | Status: AC
Start: 2019-02-02 — End: ?

## 2019-02-02 MED ORDER — PROPOFOL 10 MG/ML IV EMUL
10 mg/mL | INTRAVENOUS | Status: DC | PRN
Start: 2019-02-02 — End: 2019-02-02
  Administered 2019-02-02: 17:00:00 via INTRAVENOUS

## 2019-02-02 MED FILL — LEVOFLOXACIN IN D5W 500 MG/100 ML IV PIGGY BACK: 500 mg/100 mL | INTRAVENOUS | Qty: 100

## 2019-02-02 MED FILL — LACTATED RINGERS IV: INTRAVENOUS | Qty: 1000

## 2019-02-02 MED FILL — ONDANSETRON (PF) 4 MG/2 ML INJECTION: 4 mg/2 mL | INTRAMUSCULAR | Qty: 2

## 2019-02-02 MED FILL — DEXAMETHASONE SODIUM PHOSPHATE 4 MG/ML IJ SOLN: 4 mg/mL | INTRAMUSCULAR | Qty: 1

## 2019-02-02 MED FILL — FENTANYL CITRATE (PF) 50 MCG/ML IJ SOLN: 50 mcg/mL | INTRAMUSCULAR | Qty: 2

## 2019-02-02 MED FILL — GLYCOPYRROLATE 0.2 MG/ML IJ SOLN: 0.2 mg/mL | INTRAMUSCULAR | Qty: 2

## 2019-02-02 MED FILL — DIPRIVAN 10 MG/ML INTRAVENOUS EMULSION: 10 mg/mL | INTRAVENOUS | Qty: 20

## 2019-02-02 MED FILL — BELLADONNA ALKALOIDS-OPIUM 16.2 MG-30 MG RECTAL SUPPOSITORY: RECTAL | Qty: 1

## 2019-02-02 MED FILL — MIDAZOLAM (PF) 1 MG/ML INJECTION SOLUTION: 1 mg/mL | INTRAMUSCULAR | Qty: 2

## 2019-02-02 MED FILL — FUROSEMIDE 10 MG/ML IJ SOLN: 10 mg/mL | INTRAMUSCULAR | Qty: 4

## 2019-02-02 MED FILL — PHENYLEPHRINE 1 MG/10 ML (100 MCG/ML) IN NS IV SYRINGE: 1 mg/0 mL (00 mcg/mL) | INTRAVENOUS | Qty: 10

## 2019-02-02 NOTE — Anesthesia Post-Procedure Evaluation (Signed)
Post-Anesthesia Evaluation and Assessment    Cardiovascular Function/Vital Signs  Visit Vitals  BP 123/75   Pulse 62   Temp 36.1 ??C (97 ??F)   Resp 11   Ht 6' 1"  (1.854 m)   Wt 73 kg (161 lb)   SpO2 100%   BMI 21.24 kg/m??       Patient is status post Procedure(s):  GREENLIGHT LASER OF PROSTATE  **COVID TEST DONE 10/21 NOT RESULTED**.    Nausea/Vomiting: Controlled.    Postoperative hydration reviewed and adequate.    Pain:  Pain Scale 1: Numeric (0 - 10) (02/02/19 1410)  Pain Intensity 1: 0 (02/02/19 1410)   Managed.    Neurological Status:   Neuro (WDL): Within Defined Limits (02/02/19 1410)   At baseline.    Mental Status and Level of Consciousness: Baseline and stable.    Pulmonary Status:   O2 Device: Room air (02/02/19 1413)   Adequate oxygenation and airway patent.    Complications related to anesthesia: None    Post-anesthesia assessment completed. No concerns.    Patient has met all discharge requirements.    Signed By: Winferd Humphrey, MD

## 2019-02-02 NOTE — Interval H&P Note (Signed)
TRANSFER - IN REPORT:    Verbal report received from N.Braxton,RN(name) on Justin Cruz  being received from OR(unit) for routine progression of care      Report consisted of patient's Situation, Background, Assessment and   Recommendations(SBAR).     Information from the following report(s) SBAR was reviewed with the receiving nurse.    Opportunity for questions and clarification was provided.      Assessment completed upon patient's arrival to unit and care assumed.

## 2019-02-02 NOTE — Interval H&P Note (Signed)
Reviewed PTA medication list with patient/caregiver and patient/caregiver denies any additional medications.     Patient admits to having a responsible adult care for them at home for at least 24 hours after surgery.    Patient encouraged to use gown warming system and informed that using said warming gown to regulate body temperature prior to a procedure has been shown to help reduce the risks of blood clots and infection.    Patient's pharmacy of choice verified and documented in PTA medication section.    Dual skin assessment & fall risk band verification completed with A Thacker RN.

## 2019-02-02 NOTE — Anesthesia Pre-Procedure Evaluation (Signed)
Relevant Problems   No relevant active problems       Anesthetic History   No history of anesthetic complications            Review of Systems / Medical History  Patient summary reviewed, nursing notes reviewed and pertinent labs reviewed    Pulmonary  Within defined limits                 Neuro/Psych   Within defined limits           Cardiovascular    Hypertension              Exercise tolerance: >4 METS     GI/Hepatic/Renal         Renal disease: CRI       Endo/Other  Within defined limits           Other Findings              Physical Exam    Airway  Mallampati: II  TM Distance: 4 - 6 cm  Neck ROM: normal range of motion   Mouth opening: Normal     Cardiovascular               Dental    Dentition: Caps/crowns  Comments: Chip upper front   Pulmonary                 Abdominal         Other Findings            Anesthetic Plan    ASA: 2  Anesthesia type: general          Induction: Intravenous  Anesthetic plan and risks discussed with: Patient

## 2019-02-02 NOTE — Interval H&P Note (Signed)
Family updated

## 2019-02-02 NOTE — Op Note (Signed)
Postoperative Note    Patient: Justin Cruz  Date of Birth: [birth year]  MRN: [id number]    Date of Procedure: 02/02/2019     Pre-Op Diagnosis: BPH W/OBSTRUCTION    Post-Op Diagnosis: Same as preoperative diagnosis.      Procedure(s):  GREENLIGHT LASER OF PROSTATE  **COVID TEST DONE 10/21 NOT RESULTED**    Surgeon(s):  Neoma Laming, MD    Surgical Assistant: None    Anesthesia: General     Estimated Blood Loss (mL): Minimal    Complications: None    Specimens: * No specimens in log *     Implants: * No implants in log *    Drains: * No LDAs found *    Findings: Urinary retention      Procedure Details:    After informed consent was obtained, the patient was taken to the operating room, and he underwent laryngeal mask anesthesia in the supine position. He was then prepped and draped in the usual surgical fashion after being placed in the dorsal lithotomy position.   A 23-French laser cystoscope was inserted with visual obturator per urethra into the bladder. The ureteral orifices were identified. The verumontanum was identified. Next, the visual obturator was exchanged for a laser guide. The greenlight laser fiber was inserted through the laser guide. Lasering was begun at the bladder neck from 5 o'clock to the 7 o'clock position. Once the bladder neck was taken down, my attention then turned towards the right lobe from the bladder neck towards the verumontanum.  The left lobe was taken down in a similar fashion.  The remainder of the excess prostatic adenoma was removed with the laser, as well 4 clips from The bladder was redrained, refilled at low-volume. There was no active bleeding. 10 ml of Lasix was given IV to promote post op diuresis.  The procedure ended at this point.[id number]  joules were used.   Both ureteral orifices and the verumontanum were intact at the end of the procedure. The bladder was filled. The laser was placed on standby and the cystoscope was removed. A 22-French, 5 mL two-way Foley catheter was  inserted per urethra into the bladder draining clear urine. Ten mL of sterile water was placed in the balloon and the catheter was connected to a leg bag.   The patient was then washed off, dried, and placed in the supine position. He was awakened from anesthesia and then transferred to the post anaesthesia care unit.           Neoma Laming, MD  02/02/2019  1:41 PM

## 2019-02-02 NOTE — Other (Signed)
TRANSFER - IN REPORT:    Verbal report received from N.Braxton,RN(name) on Justin Cruz  being received from OR(unit) for routine progression of care      Report consisted of patient???s Situation, Background, Assessment and   Recommendations(SBAR).     Information from the following report(s) SBAR was reviewed with the receiving nurse.    Opportunity for questions and clarification was provided.      Assessment completed upon patient???s arrival to unit and care assumed.

## 2019-02-02 NOTE — Anesthesia Post-Procedure Evaluation (Signed)
Post-Anesthesia Evaluation and Assessment    Cardiovascular Function/Vital Signs  Visit Vitals  BP 123/75   Pulse 62   Temp 36.1 ??C (97 ??F)   Resp 11   Ht 6' 1" (1.854 m)   Wt 73 kg (161 lb)   SpO2 100%   BMI 21.24 kg/m??       Patient is status post Procedure(s):  GREENLIGHT LASER OF PROSTATE  **COVID TEST DONE 10/21 NOT RESULTED**.    Nausea/Vomiting: Controlled.    Postoperative hydration reviewed and adequate.    Pain:  Pain Scale 1: Numeric (0 - 10) (02/02/19 1410)  Pain Intensity 1: 0 (02/02/19 1410)   Managed.    Neurological Status:   Neuro (WDL): Within Defined Limits (02/02/19 1410)   At baseline.    Mental Status and Level of Consciousness: Baseline and stable.    Pulmonary Status:   O2 Device: Room air (02/02/19 1413)   Adequate oxygenation and airway patent.    Complications related to anesthesia: None    Post-anesthesia assessment completed. No concerns.    Patient has met all discharge requirements.    Signed By: Chanz Cahall P Maycie Luera, MD

## 2019-02-02 NOTE — Anesthesia Pre-Procedure Evaluation (Signed)
Relevant Problems   No relevant active problems       Anesthetic History   No history of anesthetic complications            Review of Systems / Medical History  Patient summary reviewed, nursing notes reviewed and pertinent labs reviewed    Pulmonary  Within defined limits                 Neuro/Psych   Within defined limits           Cardiovascular    Hypertension              Exercise tolerance: >4 METS     GI/Hepatic/Renal         Renal disease: CRI       Endo/Other  Within defined limits           Other Findings              Physical Exam    Airway  Mallampati: II  TM Distance: 4 - 6 cm  Neck ROM: normal range of motion   Mouth opening: Normal     Cardiovascular               Dental    Dentition: Caps/crowns  Comments: Chip upper front   Pulmonary                 Abdominal         Other Findings            Anesthetic Plan    ASA: 2  Anesthesia type: general          Induction: Intravenous  Anesthetic plan and risks discussed with: Patient

## 2019-02-02 NOTE — Op Note (Signed)
Postoperative Note    Patient: Justin Cruz  Date of Birth: 12/24/1960  MRN: [id number]    Date of Procedure: 02/02/2019     Pre-Op Diagnosis: BPH W/OBSTRUCTION    Post-Op Diagnosis: Same as preoperative diagnosis.      Procedure(s):  GREENLIGHT LASER OF PROSTATE  **COVID TEST DONE 10/21 NOT RESULTED**    Surgeon(s):  Calvyn Kurtzman, MD    Surgical Assistant: None    Anesthesia: General     Estimated Blood Loss (mL): Minimal    Complications: None    Specimens: * No specimens in log *     Implants: * No implants in log *    Drains: * No LDAs found *    Findings: Urinary retention      Procedure Details:    After informed consent was obtained, the patient was taken to the operating room, and he underwent laryngeal mask anesthesia in the supine position. He was then prepped and draped in the usual surgical fashion after being placed in the dorsal lithotomy position.   A 23-French laser cystoscope was inserted with visual obturator per urethra into the bladder. The ureteral orifices were identified. The verumontanum was identified. Next, the visual obturator was exchanged for a laser guide. The greenlight laser fiber was inserted through the laser guide. Lasering was begun at the bladder neck from 5 o'clock to the 7 o'clock position. Once the bladder neck was taken down, my attention then turned towards the right lobe from the bladder neck towards the verumontanum.  The left lobe was taken down in a similar fashion.  The remainder of the excess prostatic adenoma was removed with the laser, as well 4 clips from The bladder was redrained, refilled at low-volume. There was no active bleeding. 10 ml of Lasix was given IV to promote post op diuresis.  The procedure ended at this point.[id number]  joules were used.   Both ureteral orifices and the verumontanum were intact at the end of the procedure. The bladder was filled. The laser was placed on standby and the cystoscope was removed. A 22-French, 5 mL two-way Foley catheter was  inserted per urethra into the bladder draining clear urine. Ten mL of sterile water was placed in the balloon and the catheter was connected to a leg bag.   The patient was then washed off, dried, and placed in the supine position. He was awakened from anesthesia and then transferred to the post anaesthesia care unit.           Joby Hershkowitz, MD  02/02/2019  1:41 PM

## 2019-02-02 NOTE — Interval H&P Note (Signed)
Update History & Physical    The Patient's History and Physical of February 02, 2019 was reviewed with the patient and I examined the patient.  There was no change.  The surgical site was confirmed by the patient and me.    Plan:  The risk, benefits, expected outcome, and alternative to the recommended procedure have been discussed with the patient.  Patient understands and wants to proceed with the procedure.    Electronically signed by Sunday Spillers, MD on 02/02/2019 at 11:56 AM

## 2019-02-02 NOTE — Other (Signed)
Reviewed PTA medication list with patient/caregiver and patient/caregiver denies any additional medications.     Patient admits to having a responsible adult care for them at home for at least 24 hours after surgery.    Patient encouraged to use gown warming system and informed that using said warming gown to regulate body temperature prior to a procedure has been shown to help reduce the risks of blood clots and infection.    Patient's pharmacy of choice verified and documented in PTA medication section.    Dual skin assessment & fall risk band verification completed with A Thacker RN.

## 2019-02-02 NOTE — Other (Signed)
Family updated

## 2019-02-03 LAB — NOVEL CORONAVIRUS (COVID-19): SARS-CoV-2: NOT DETECTED

## 2019-02-03 LAB — COVID-19: SARS-CoV-2: NOT DETECTED

## 2020-03-21 ENCOUNTER — Ambulatory Visit: Attending: Orthopaedic Surgery | Primary: Family Medicine

## 2020-03-21 ENCOUNTER — Ambulatory Visit
Admit: 2020-03-21 | Discharge: 2020-03-21 | Payer: BLUE CROSS/BLUE SHIELD | Attending: Orthopaedic Surgery | Primary: Family Medicine

## 2020-03-21 DIAGNOSIS — S83282A Other tear of lateral meniscus, current injury, left knee, initial encounter: Secondary | ICD-10-CM

## 2020-03-21 MED ORDER — MELOXICAM 15 MG TAB
15 mg | ORAL_TABLET | Freq: Every day | ORAL | 1 refills | Status: AC
Start: 2020-03-21 — End: ?

## 2020-03-21 NOTE — Progress Notes (Signed)
Progress Notes by Bettina Gavia, MD at 03/21/20 [id number]                Author: Bettina Gavia, MD  Service: --  Author Type: Physician       Filed: 03/21/20 1158  Encounter Date: 03/21/2020  Status: Signed          Editor: Bettina Gavia, MD (Physician)                          ALFONZO ARCA   1960-05-28      Chief Complaint       Patient presents with        ?  Knee Pain             left knee            HISTORY OF PRESENT ILLNESS   Justin Cruz is a 59 y.o.  male who presents today for evaluation of left knee. He rates  his pain 6/10 today. Pain has been present for at least 6 months - november 2021. Pain in the back of the knee and notes limping. Notes the pain gradually got worse over the 6 months. Played soccer  in college. Has pain with bending, squatting, kneeling. Works out usually 4 x a week but has had to stop due to the pain. He enjoys rowing.  Has been stretching at home.    Patient describes the pain as aching, sharp and clicking that is Intermittent in nature. Symptoms are worse with  bending, stretching and straightening and is better with  Rest. Associated symptoms include  nothing. Since problem started, it: is unchanged. Pain does not wake patient up at night.  Has taken Tylenol for the problem. Has tried following treatments: Injections:NO; Brace:NO; Therapy:NO; Cane/Crutch:NO          No Known Allergies         Past Medical History:        Diagnosis  Date         ?  BPH (benign prostatic hyperplasia)       ?  Hypertension  around 2010     ?  Kidney disease            Stage 3           Social History          Socioeconomic History         ?  Marital status:  MARRIED              Spouse name:  Not on file         ?  Number of children:  Not on file     ?  Years of education:  Not on file     ?  Highest education level:  Not on file       Occupational History        ?  Not on file       Tobacco Use         ?  Smoking status:  Never Smoker     ?  Smokeless tobacco:  Never  Used       Substance and Sexual Activity         ?  Alcohol use:  Yes              Alcohol/week:  14.0 standard drinks         Types:  14 Glasses  of wine per week         ?  Drug use:  Never     ?  Sexual activity:  Not on file        Other Topics  Concern        ?  Not on file       Social History Narrative        ?  Not on file          Social Determinants of Health          Financial Resource Strain:         ?  Difficulty of Paying Living Expenses: Not on file       Food Insecurity:         ?  Worried About Running Out of Food in the Last Year: Not on file     ?  Ran Out of Food in the Last Year: Not on file       Transportation Needs:         ?  Lack of Transportation (Medical): Not on file     ?  Lack of Transportation (Non-Medical): Not on file       Physical Activity:         ?  Days of Exercise per Week: Not on file     ?  Minutes of Exercise per Session: Not on file       Stress:         ?  Feeling of Stress : Not on file       Social Connections:         ?  Frequency of Communication with Friends and Family: Not on file     ?  Frequency of Social Gatherings with Friends and Family: Not on file     ?  Attends Religious Services: Not on file     ?  Active Member of Clubs or Organizations: Not on file     ?  Attends Banker Meetings: Not on file     ?  Marital Status: Not on file       Intimate Partner Violence:         ?  Fear of Current or Ex-Partner: Not on file     ?  Emotionally Abused: Not on file     ?  Physically Abused: Not on file     ?  Sexually Abused: Not on file       Housing Stability:         ?  Unable to Pay for Housing in the Last Year: Not on file     ?  Number of Places Lived in the Last Year: Not on file        ?  Unstable Housing in the Last Year: Not on file           Past Surgical History:         Procedure  Laterality  Date          ?  HX GI              colonoscopy          ?  HX HERNIA REPAIR              umbilical          ?  HX UROLOGICAL    12/2018          prostate  Family History         Problem  Relation  Age of Onset          ?  Other  Mother                bladder cancer           Current Outpatient Medications        Medication  Sig         ?  aspirin delayed-release 81 mg tablet  take 1 tablet by oral route  every day     ?  fish oil-omega-3 fatty acids (Fish Oil) 300-500 mg cap  Take  by mouth.         ?  ascorbic acid, vitamin C, (Vitamin C) 500 mg tablet  Take  by mouth.         ?  multivitamin (ONE A DAY) tablet  Take 1 Tab by mouth daily.     ?  telmisartan (MICARDIS) 80 mg tablet  Take 80 mg by mouth nightly. Indications: high blood pressure     ?  febuxostat (ULORIC) 80 mg tab tablet  Take 80 mg by mouth daily. Indications: treatment to prevent acute gout attack         ?  NITROFURANTOIN PO  Take 1 Tab by mouth every twelve (12) hours. (Patient not taking: Reported on 03/21/2020)          No current facility-administered medications for this visit.           REVIEW OF SYSTEM    Patient denies: Weight loss, Fever/Chills, HA, Visual changes, Fatigue, Chest pain, SOB, Abdominal pain, N/V/D/C, Blood in stool or urine, Edema.    Pertinent positive as above in HPI. All others were negative      PHYSICAL EXAM:    Visit Vitals      Pulse  66     Temp  97.3 ??F (36.3 ??C) (Temporal)     Ht  6\' 1"  (1.854 m)     Wt  172 lb (78 kg)     SpO2  99%        BMI  22.69 kg/m??        The patient is a well-developed, well-nourished male    in no acute distress.  The patient is alert and oriented times three.  The patient is alert and oriented times three. Mood and affect are normal.   LYMPHATIC: lymph nodes are not enlarged and are within normal limits   SKIN: normal in color and non tender to palpation. There are no bruises or abrasions noted.    NEUROLOGICAL: Motor sensory exam is within normal limits. Reflexes are equal bilaterally. There is normal sensation to pinprick and light touch   MUSCULOSKELETAL:      Examination  Left knee     Skin  Intact     Range of motion  10-120         Effusion  -        Medial joint line tenderness  -     Lateral joint line tenderness  +        Tenderness Pes Bursa  -     Tenderness insertion MCL  -     Tenderness insertion LCL  -     McMurrays  -        Patella crepitus  -     Patella grind  -     Lachman  -  Pivot shift  -     Anterior drawer  -     Posterior drawer  -     Varus stress  -     Valgus stress  -        Neurovascular  Intact        Calf Swelling and Tenderness to Palpation  -     Homan's Test  -        Hamstring Cord Tightness  +          IMAGING: XR of the left knee with 2 views obtained in the office dated 03/21/2020 was reviewed and read by Dr. Idelle CrouchLuciano: Mild degenerative  changes in the patellofemoral joint       IMPRESSION:               ICD-10-CM  ICD-9-CM             1.  Tear of lateral meniscus of left knee, current, unspecified tear type, initial encounter   S83.282A  836.1       2.  Chronic pain of left knee   M25.562  719.46  AMB POC XRAY, KNEE; 1/2 VIEWS            G89.29  338.29             3.  Patellofemoral arthritis of left knee   M17.12  716.96              PLAN:   1. Patient presents today with left knee pain due to patellofemoral arthritis. Due to the recurrent pain accompanied with limping, I have concerns of a meniscus injury and would like to order a MRI.  Prescribed mobic to help with pain and inflammation. Provided him with hamstring stretches to start while getting the MRI done. Return following MRI     Risk factors include: CKD    2. No ultrasound exam indicated today   3. No cortisone injection indicated today    4. No Physical/Occupational Therapy indicated today   5. Yes diagnostic test indicated today: L KNEE MRI   6. No durable medical equipment indicated today   7. No referral indicated today    8. Yes medications indicated today: MOBIC   9. No Narcotic indicated today      RTC Following MRI          Scribed by Curt JewsKristen Harkins [facility]) as dictated by Bettina GaviaErnesto Luciano-Perez, MD      I, Dr. Bettina GaviaErnesto  Luciano-Perez, confirm that all documentation is accurate.      Bettina GaviaErnesto Luciano-Perez, M.D.    [facility]

## 2020-03-30 ENCOUNTER — Inpatient Hospital Stay: Admit: 2020-03-30 | Payer: BLUE CROSS/BLUE SHIELD | Attending: Orthopaedic Surgery | Primary: Family Medicine

## 2020-03-30 DIAGNOSIS — S83282A Other tear of lateral meniscus, current injury, left knee, initial encounter: Secondary | ICD-10-CM

## 2020-04-03 ENCOUNTER — Ambulatory Visit: Attending: Orthopaedic Surgery | Primary: Family Medicine

## 2020-04-03 ENCOUNTER — Ambulatory Visit
Admit: 2020-04-03 | Discharge: 2020-04-03 | Payer: BLUE CROSS/BLUE SHIELD | Attending: Orthopaedic Surgery | Primary: Family Medicine

## 2020-04-03 DIAGNOSIS — M7052 Other bursitis of knee, left knee: Secondary | ICD-10-CM

## 2020-04-03 MED ORDER — TRIAMCINOLONE ACETONIDE 40 MG/ML SUSP FOR INJECTION
40 mg/mL | Freq: Once | INTRAMUSCULAR | Status: AC
Start: 2020-04-03 — End: 2020-04-03
  Administered 2020-04-03: 22:00:00 via INTRA_ARTICULAR

## 2020-04-03 NOTE — Progress Notes (Signed)
Progress Notes by Bettina Gavia, MD at 04/03/20 1610                Author: Bettina Gavia, MD  Service: --  Author Type: Physician       Filed: 04/03/20 1701  Encounter Date: 04/03/2020  Status: Signed          Editor: Bettina Gavia, MD (Physician)                          Justin Cruz   06-09-1960      Chief Complaint       Patient presents with        ?  Knee Pain             left knee, mri results            HISTORY OF PRESENT ILLNESS   Justin Cruz is a 59 y.o.  male who presents today for revaluation and MRI review of left knee. He rates  his pain 6/10 today. Pain has been present for at least 7 months - november 2021. Pain in the back of the knee and notes limping. Notes the pain gradually got worse over the 7 months. Played soccer  in college. Has pain with bending, squatting, kneeling. Works out usually 4 x a week but has had to stop due to the pain. He enjoys rowing.  Has been stretching at home.    Patient denies any fever, chills, chest pain, shortness of breath or calf pain. The remainder of the review of systems is negative. There are no new illness or injuries to report since last seen in the office. There are no changes to medications, allergies,  family or social history.       PHYSICAL EXAM:    Visit Vitals      Pulse  85     Temp  97.8 ??F (36.6 ??C) (Temporal)     Ht  6\' 1"  (1.854 m)     Wt  172 lb (78 kg)     SpO2  98%        BMI  22.69 kg/m??        The patient is a well-developed, well-nourished male    in no acute distress.  The patient is alert and oriented times three.  The patient is alert and oriented times three. Mood and affect are normal.   LYMPHATIC: lymph nodes are not enlarged and are within normal limits   SKIN: normal in color and non tender to palpation. There are no bruises or abrasions noted.    NEUROLOGICAL: Motor sensory exam is within normal limits. Reflexes are equal bilaterally. There is normal sensation to pinprick and light touch    MUSCULOSKELETAL:      Examination  Left knee     Skin  Intact     Range of motion  10-120        Effusion  -        Medial joint line tenderness  -     Lateral joint line tenderness  +        Tenderness Pes Bursa  +     Tenderness insertion MCL  -     Tenderness insertion LCL  -     McMurrays  -        Patella crepitus  -     Patella grind  -     Lachman  -  Pivot shift  -     Anterior drawer  -     Posterior drawer  -     Varus stress  -     Valgus stress  -        Neurovascular  Intact        Calf Swelling and Tenderness to Palpation  -     Homan's Test  -        Hamstring Cord Tightness  +        PROCEDURE:    Left knee Injection with Ultrasound Guidance      Indication:Left Knee pain/swelling      After sterile prep, 3 cc of Xylocaine and 1 cc of Kenalog were injected into the left  Knee. Intra-articular Ultrasound images captured using [employer] Ultrasound machine  using a frequency of 10 MHz with a linear transducer and scanned into patient's chart.                [facility]   OFFICE PROCEDURE PROGRESS NOTE            Chart reviewed for the following:   I, Bettina Gavia, M.D, have reviewed the History, Physical and updated the Allergic reactions for Justin Cruz        TIME OUT performed immediately prior to start of procedure:   I, Bettina Gavia, M.D, have performed the following reviews on Justin Cruz prior  to the start of the procedure:              * Patient was identified by name and date of birth    * Agreement on procedure being performed was verified   * Risks and Benefits explained to the patient   * Procedure site verified and marked as necessary   * Patient was positioned for comfort   * Needle placement confirmed by ultrasound   * Consent was signed and verified       Time: 4:29 PM       Date of procedure: 04/03/2020      Procedure performed by:  Bettina Gavia, M.D      Provider assisted by: (see medication  administration)      How tolerated by patient: tolerated the procedure well with no complications      Comments: none        IMAGING: MRI of the left knee dated 03/30/2020 was reviewed and read by Dr. Idelle Crouch:    IMPRESSION   No meniscal tear identified.   ??   Findings suggest low-grade patellofemoral maltracking and iliotibial band   friction syndrome.   ??   Moderate popliteal origin tendinosis.   ??   Moderate pes anserine bursitis.   ??   Disproportionate patellofemoral chondrosis with full-thickness medial patellar facet cartilage loss.   ??   Prominent 0.8 x 1.8 x 1.6 and meter lymph node at the level of the distal   femoral diaphysis of uncertain clinical significance.             XR of the left knee with 2 views obtained in the office dated 03/21/2020 was reviewed and read by Dr. Idelle Crouch: Mild degenerative changes in the patellofemoral joint       IMPRESSION:               ICD-10-CM  ICD-9-CM             1.  Pes anserinus bursitis of left knee  M70.52  726.61             2.  Patellofemoral arthritis of left knee   M17.12  716.96              PLAN:   1. Patient presents today with left knee pain due to patellofemoral arthritis and pes bursitis. I would like to try a cortisone injection and PT today. Return in 4 weeks.    Risk factors include: CKD    2. No ultrasound exam indicated today   3. Yes cortisone injection indicated today L PES BURSA [place]    4. Yes Physical/Occupational Therapy indicated today   5. No diagnostic test indicated today   6. No durable medical equipment indicated today   7. No referral indicated today    8. No medications indicated today   9. No Narcotic indicated today      RTC 4 weeks          Scribed by Curt Jews [facility]) as dictated by Bettina Gavia, MD      I, Dr. Bettina Gavia, confirm that all documentation is accurate.      Bettina Gavia, M.D.    [place]

## 2020-05-15 ENCOUNTER — Encounter: Payer: BLUE CROSS/BLUE SHIELD | Primary: Family Medicine

## 2020-05-17 ENCOUNTER — Encounter: Payer: BLUE CROSS/BLUE SHIELD | Primary: Family Medicine

## 2020-05-21 ENCOUNTER — Inpatient Hospital Stay: Admit: 2020-05-21 | Payer: BLUE CROSS/BLUE SHIELD | Primary: Family Medicine

## 2020-05-21 DIAGNOSIS — M25562 Pain in left knee: Secondary | ICD-10-CM

## 2020-05-21 NOTE — Progress Notes (Signed)
 PT DAILY TREATMENT NOTE 06-21    Patient Name: Justin Cruz  Date:05/21/2020  DOB: Feb 10, 1961  [x]   Patient DOB Verified  Payor: BLUE CROSS / Plan: [place] OF [place] / Product Type: PPO /    In time:1000  Out time:1045  Total Treatment Time (min): 45  Visit #: 1 of 4    Medicare/BCBS Only   Total Timed Codes (min):  15 1:1 Treatment Time:  45       Treatment Area: Left knee pain [M25.562]    SUBJECTIVE  Pain Level (0-10 scale): 2  Any medication changes, allergies to medications, adverse drug reactions, diagnosis change, or new procedure performed?: [x]  No    []  Yes (see summary sheet for update)  Subjective functional status/changes:   []  No changes reported       OBJECTIVE        Min Type Additional Details    []  Estim:  [] Unatt       [] IFC  [] Premod                        [] Other:  [] w/ice   [] w/heat  Position:  Location:    []  Estim: [] Att    [] TENS instruct  [] NMES                    [] Other:  [] w/US    [] w/ice   [] w/heat  Position:  Location:    []   Traction: []  Cervical       [] Lumbar                       []  Prone          [] Supine                       [] Intermittent   [] Continuous Lbs:  []  before manual  []  after manual    []   Ultrasound: [] Continuous   []  Pulsed                           []   [] W/cm2:  Location:    []   Iontophoresis with dexamethasone         Location: []  Take home patch   []  In clinic    []   Ice     []   heat  []   Ice massage  []   Laser   []   Anodyne Position:  Location:    []   Laser with stim  []   Other:  Position:  Location:    []   Vasopneumatic Device    []   Right     []   Left  Pre-treatment girth:  Post-treatment girth:  Measured at (location):  Pressure:       []  lo []  med []  hi   Temperature: []  lo []  med []  hi   []  Skin assessment post-treatment:  [] intact [] redness- no adverse reaction    [] redness - adverse reaction:     30 min [x] Eval                  [] Re-Eval       15 min Therapeutic Exercise:  []  See flow sheet : HEP   Rationale: increase ROM and increase  strength to improve the patient's ability to perform daily activities            With   []  TE   []   TA   []  neuro   []  other: Patient Education: [x]  Review HEP    []  Progressed/Changed HEP based on:   []  positioning   []  body mechanics   []  transfers   []  heat/ice application    []  other:      Other Objective/Functional Measures:       Pain Level (0-10 scale) post treatment: 2    ASSESSMENT/Changes in Function:      Patient will continue to benefit from skilled PT services to modify and progress therapeutic interventions, address functional mobility deficits, address ROM deficits, address strength deficits, analyze and address soft tissue restrictions and analyze and cue movement patterns to attain remaining goals.     [x]   See Plan of Care  []   See progress note/recertification  []   See Discharge Summary         Progress towards goals / Updated goals:  Short Term Goals: To be accomplished in 1 treatments:   1. The pt will be I and compliant with HEP   IE- issued HEP  Long Term Goals: To be accomplished in 4 weeks:   1. Improve FOTO score to predicted outcome for improved ability for ADL   IE- 72   2. Improve left glute max MMT to 4/5 for improved ability for daily activities   IE- 3+/5   3. The pt will report no difficulty with walking a mile   IE- a little difficulty         PLAN  []   Upgrade activities as tolerated     [x]   Continue plan of care  []   Update interventions per flow sheet       []   Discharge due to:_  []   Other:_      Tanda FORBES Dixons, PT 05/21/2020  12:41 PM    Future Appointments   Date Time Provider Department Center   06/11/2020  8:30 AM Dixons, Tanda FORBES, PT [id number]

## 2020-05-21 NOTE — Progress Notes (Signed)
 [facility]  [address] [place]  [place], [place] [address]  [phone]  [phone] fax    Plan of Care/ Statement of Necessity for Physical Therapy Services    Patient name: Justin Cruz Start of Care: 05/21/2020   Referral source: Vale Hover,* DOB: 1960-08-08    Medical Diagnosis: Left knee pain [M25.562]  Payor: BLUE CROSS / Plan: [place] BLUE [place] OUT OF STATE / Product Type: PPO /  Onset Date:chronic, recent increase    Treatment Diagnosis: left knee pain   Prior Hospitalization: see medical history Provider#: [id number]   Medications: Verified on Patient summary List    Comorbidities: OA, HTN, kidney disease   Prior Level of Function: functionally I with all activities      The Plan of Care and following information is based on the information from the initial evaluation.  Assessment/ key information: 60 y/o male presents with c/o left knee pain that started about 2 years ago but has recently worsened. No mechanism of injury is reported. The pt reports much improvement after receiving an injection from ortho. The pt demonstrates full ROM of the left knee without complaint. Decreased flexibility is noted B hamstrings, quads, hip flexors. Limited left hip mobility as compared to the right. MMT reveals weakness in B glute med and glute max, with the left being weaker. No significant tenderness to palpation is noted at this time. The pt will benefit from PT to address the aforementioned impairments. At this time the pt requests to perform HEP and f/u with PT in a few weeks.     Evaluation Complexity History MEDIUM  Complexity : 1-2 comorbidities / personal factors will impact the outcome/ POC ; Examination MEDIUM Complexity : 3 Standardized tests and measures addressing body structure, function, activity limitation and / or participation in recreation  ;Presentation LOW Complexity : Stable, uncomplicated  ;Clinical Decision Making MEDIUM Complexity : FOTO score of  26-74  Overall Complexity Rating: LOW   Problem List: pain affecting function, decrease ROM, decrease strength, decrease ADL/ functional abilitiies, decrease activity tolerance and decrease flexibility/ joint mobility   Treatment Plan may include any combination of the following: Therapeutic exercise, Therapeutic activities, Neuromuscular re-education, Physical agent/modality, Manual therapy, Patient education and Self Care training  Patient / Family readiness to learn indicated by: asking questions, trying to perform skills and interest  Persons(s) to be included in education: patient (P)  Barriers to Learning/Limitations: None  Patient Goal (s): "To learn some exercises to do without increasing pain"  Patient Self Reported Health Status: good  Rehabilitation Potential: good    Short Term Goals: To be accomplished in 1 treatments:   1. The pt will be I and compliant with HEP     Long Term Goals: To be accomplished in 4 weeks:   1. Improve FOTO score to predicted outcome for improved ability for ADL   2. Improve left glute max MMT to 4/5 for improved ability for daily activities   3. The pt will report no difficulty with walking a mile       Frequency / Duration: Patient to be seen 1-2 times per week for 4 weeks.    Patient/ Caregiver education and instruction: Diagnosis, prognosis, self care, activity modification and exercises   [x]   Plan of care has been reviewed with PTA    Tanda FORBES Dixons, PT 05/21/2020 12:41 PM    ________________________________________________________________________    I certify that the above Therapy Services  are being furnished while the patient is under my care. I agree with the treatment plan and certify that this therapy is necessary.    Physician's Signature:____________Date:_________TIME:________     Vale Hover,*  ** Signature, Date and Time must be completed for valid certification **    Please sign and return to [facility]  [address]  [place] [address]  [place], [place] [address]  [phone]  [phone] fax

## 2020-06-11 ENCOUNTER — Inpatient Hospital Stay: Admit: 2020-06-11 | Payer: BLUE CROSS/BLUE SHIELD | Primary: Family Medicine

## 2020-06-11 DIAGNOSIS — M25562 Pain in left knee: Secondary | ICD-10-CM

## 2020-06-11 NOTE — Progress Notes (Signed)
 PT DISCHARGE DAILY NOTE AND [id number]    Patient name: Justin Cruz Start of Care: 05/21/2020   Referral source: Vale Hover,* DOB: 01/28/1961                Medical Diagnosis: Left knee pain [M25.562]  Payor: BLUE CROSS / Plan: [place] / Product Type: PPO /  Onset Date:chronic, recent increase                Treatment Diagnosis: left knee pain   Prior Hospitalization: see medical history Provider#: [id number]   Medications: Verified on Patient summary List    Comorbidities: OA, HTN, kidney disease   Prior Level of Function: functionally I with all activities       Visits from Start of Care: 2    Missed Visits: 0    Reporting Period : 05-21-20 to 06-11-20    Date:06/11/2020  DOB: 1960-09-24  [x]   Patient DOB Verified  Payor: BLUE CROSS / Plan: [facility] [place] / Product Type: PPO /    In time:0830  Out upfz:[id number]  Total Treatment Time (min): 48  Visit #: 2 of 4    Medicare/BCBS Only   Total Timed Codes (min):  48 1:1 Treatment Time:  48       SUBJECTIVE  Pain Level (0-10 scale): 0  Any medication changes, allergies to medications, adverse drug reactions, diagnosis change, or new procedure performed?: [x]  No    []  Yes (see summary sheet for update)  Subjective functional status/changes:   []  No changes reported  The pt reports HEP compliance with no questions. He reports he feels ready for discharge from PT and is now able to self manage.     OBJECTIVE        33 min Therapeutic Exercise:  [x]  See flow sheet :   Rationale: increase ROM and increase strength to improve the patient's ability to perform ADL    15 min Neuromuscular Re-education:  [x]   See flow sheet :   Rationale: increase strength, improve coordination and increase proprioception  to improve the patient's ability to perform ADl            With   []  TE   []  TA   []  neuro   []  other: Patient Education: [x]  Review HEP    []  Progressed/Changed HEP based on:   []  positioning   []  body mechanics   []  transfers   []  heat/ice  application    []  other:      Other Objective/Functional Measures:       Pain Level (0-10 scale) post treatment: 0    Summary of Care:  Short Term Goals: To be accomplished in 1 treatments:              1. The pt will be I and compliant with HEP              IE- issued HEP   Current: MET  Long Term Goals: To be accomplished in 4 weeks:              1. Improve FOTO score to predicted outcome for improved ability for ADL              IE- 72   Current: MET              2. Improve left glute max MMT to 4/5 for improved ability for daily activities  IE- 3+/5   Current: progressing 4-/5 on 06-11-20              3. The pt will report no difficulty with walking a mile              IE- a little difficulty   Current: MET    ASSESSMENT/Changes in Function: The pt show good progress to date and feels he is now ready for discharge. He has met most of the goals as noted above.     Thank you for this referral!      PLAN  [x] Discontinue therapy: [x] Patient has reached or is progressing toward goals      [] Patient is non-compliant or has abdicated      [] Due to lack of appreciable progress towards set goals    Tanda FORBES [place]ons, PT 06/11/2020  8:47 AM

## 2022-04-28 ENCOUNTER — Encounter: Payer: Self-pay | Admitting: Internal Medicine
